# Patient Record
Sex: Female | Born: 1990 | Hispanic: No | Marital: Married | Smoking: Never smoker
Health system: Southern US, Community
[De-identification: ages and names within clinical notes are randomized; demographics above are authoritative.]

## PROBLEM LIST (undated history)

## (undated) ENCOUNTER — Inpatient Hospital Stay (HOSPITAL_COMMUNITY): Payer: Self-pay

## (undated) DIAGNOSIS — D649 Anemia, unspecified: Secondary | ICD-10-CM

## (undated) DIAGNOSIS — Z789 Other specified health status: Secondary | ICD-10-CM

## (undated) DIAGNOSIS — M069 Rheumatoid arthritis, unspecified: Secondary | ICD-10-CM

## (undated) HISTORY — DX: Rheumatoid arthritis, unspecified: M06.9

## (undated) HISTORY — PX: NO PAST SURGERIES: SHX2092

---

## 2016-11-13 NOTE — L&D Delivery Note (Addendum)
Patient was C/C/+1 and pushed for 1hr 30 minutes with epidural.   NSVD female infant, Apgars 8/9, weight pending.   The patient had 2nd laceration repaired with 2-0 vicryl Fundus was firm. EBL was expected amount. Placenta was delivered intact. Vagina was clear.  Pt developed temp to 100.3 and baby was tachycardic, tylenol was given and abx arrived after delivery, advised RN to administer one post delivery dose of amp and gent.  Baby was vigorous and doing skin to skin with mother.  Jamie Kerr, Jamie Kerr

## 2017-06-11 LAB — OB RESULTS CONSOLE RUBELLA ANTIBODY, IGM: RUBELLA: IMMUNE

## 2017-06-11 LAB — OB RESULTS CONSOLE HEPATITIS B SURFACE ANTIGEN: HEP B S AG: NEGATIVE

## 2017-06-11 LAB — OB RESULTS CONSOLE ABO/RH: RH Type: POSITIVE

## 2017-06-11 LAB — OB RESULTS CONSOLE HIV ANTIBODY (ROUTINE TESTING): HIV: NONREACTIVE

## 2017-06-11 LAB — OB RESULTS CONSOLE RPR: RPR: NONREACTIVE

## 2017-06-11 LAB — OB RESULTS CONSOLE GC/CHLAMYDIA
CHLAMYDIA, DNA PROBE: NEGATIVE
Gonorrhea: NEGATIVE

## 2017-06-11 LAB — OB RESULTS CONSOLE ANTIBODY SCREEN: ANTIBODY SCREEN: NEGATIVE

## 2017-07-08 ENCOUNTER — Encounter (HOSPITAL_COMMUNITY): Payer: Self-pay

## 2017-07-08 ENCOUNTER — Inpatient Hospital Stay (HOSPITAL_COMMUNITY)
Admission: AD | Admit: 2017-07-08 | Discharge: 2017-07-08 | Disposition: A | Discharge status: Home / Self Care | Payer: Medicaid Other | Source: Ambulatory Visit | Attending: Obstetrics & Gynecology | Admitting: Obstetrics & Gynecology

## 2017-07-08 ENCOUNTER — Inpatient Hospital Stay (HOSPITAL_COMMUNITY): Payer: Medicaid Other

## 2017-07-08 DIAGNOSIS — R1030 Lower abdominal pain, unspecified: Secondary | ICD-10-CM | POA: Diagnosis not present

## 2017-07-08 DIAGNOSIS — O4702 False labor before 37 completed weeks of gestation, second trimester: Secondary | ICD-10-CM

## 2017-07-08 DIAGNOSIS — Z79899 Other long term (current) drug therapy: Secondary | ICD-10-CM | POA: Diagnosis not present

## 2017-07-08 DIAGNOSIS — Z3686 Encounter for antenatal screening for cervical length: Secondary | ICD-10-CM

## 2017-07-08 DIAGNOSIS — O26892 Other specified pregnancy related conditions, second trimester: Secondary | ICD-10-CM | POA: Insufficient documentation

## 2017-07-08 DIAGNOSIS — Z3A25 25 weeks gestation of pregnancy: Secondary | ICD-10-CM | POA: Insufficient documentation

## 2017-07-08 DIAGNOSIS — R109 Unspecified abdominal pain: Secondary | ICD-10-CM

## 2017-07-08 HISTORY — DX: Other specified health status: Z78.9

## 2017-07-08 LAB — URINALYSIS, ROUTINE W REFLEX MICROSCOPIC
Bilirubin Urine: NEGATIVE
GLUCOSE, UA: NEGATIVE mg/dL
HGB URINE DIPSTICK: NEGATIVE
Ketones, ur: NEGATIVE mg/dL
LEUKOCYTES UA: NEGATIVE
Nitrite: NEGATIVE
Protein, ur: NEGATIVE mg/dL
SPECIFIC GRAVITY, URINE: 1.006 (ref 1.005–1.030)
pH: 7 (ref 5.0–8.0)

## 2017-07-08 LAB — WET PREP, GENITAL
CLUE CELLS WET PREP: NONE SEEN
Sperm: NONE SEEN
Trich, Wet Prep: NONE SEEN
Yeast Wet Prep HPF POC: NONE SEEN

## 2017-07-08 MED ORDER — NIFEDIPINE 10 MG PO CAPS
10.0000 mg | ORAL_CAPSULE | Freq: Once | ORAL | Status: AC
Start: 2017-07-08 — End: 2017-07-08
  Administered 2017-07-08: 10 mg via ORAL
  Filled 2017-07-08: qty 1

## 2017-07-08 MED ORDER — NIFEDIPINE 10 MG PO CAPS: 10.0000 mg | ORAL_CAPSULE | ORAL | Status: DC | PRN

## 2017-07-08 MED ORDER — SODIUM CHLORIDE 0.9 % IV BOLUS (SEPSIS): 1000.0000 mL | Freq: Once | INTRAVENOUS | Status: DC

## 2017-07-08 NOTE — MAU Provider Note (Signed)
History     CSN: [id number]  Arrival date and time: 07/08/17 1146   First Provider Initiated Contact with Patient 07/08/17 1208      Chief Complaint  Patient presents with  . Contractions   HPI Jamie Kerr 26 y.o. [redacted]w[redacted]d  Was having lower abdominal pain last night and still is having some pain today.  Came in for evaluation.  Speaks Urdu.  Phone interpreter used for medical interpretation for interview and exam.  Next appointment in the office is Sept 13.      OB History    Gravida Para Term Preterm AB Living   1             SAB TAB Ectopic Multiple Live Births                  Past Medical History:  Diagnosis Date  . Medical history non-contributory     Past Surgical History:  Procedure Laterality Date  . NO PAST SURGERIES      History reviewed. No pertinent family history.  Social History  Substance Use Topics  . Smoking status: Never Smoker  . Smokeless tobacco: Never Used  . Alcohol use No    Allergies: No Known Allergies  Prescriptions Prior to Admission  Medication Sig Dispense Refill Last Dose  . ferrous sulfate 325 (65 FE) MG tablet Take 325 mg by mouth daily with breakfast.   07/08/2017 at Unknown time  . Prenatal Vit-Fe Fumarate-FA (PRENATAL MULTIVITAMIN) TABS tablet Take 1 tablet by mouth daily.   07/08/2017 at Unknown time    Review of Systems  Constitutional: Negative for fever.  Gastrointestinal: Positive for abdominal pain. Negative for constipation, diarrhea, nausea and vomiting.  Genitourinary: Positive for vaginal discharge. Negative for dysuria.       Has periodic vaginal discharge   Physical Exam   Blood pressure 112/87, pulse 85, temperature 98.6 F (37 C), temperature source Oral, resp. rate 16, height 5\' 3"  (1.6 m), weight 128 lb (58.1 kg).  Physical Exam  Nursing note and vitals reviewed. Constitutional: She is oriented to person, place, and time. She appears well-developed and well-nourished.  HENT:  Head: Normocephalic.   Eyes: EOM are normal.  Neck: Neck supple.  GI: Soft. There is no tenderness.  Fetal monitor applied.  No uterine contractions palpated.  Periodic very mild uterine irritability seen on the monitor strip.  Client states her pain is less now than last night.  FHT  Genitourinary:  Genitourinary Comments: Speculum exam: Introitus - small opening, slightly red, no discharge seen, nontender Vagina - Small amount of white discharge, no odor Cervix - No contact bleeding, appears closed Client tense with speculum exam Bimanual exam: Gentle one finger exam - unable to reach cervix wet prep done Chaperone present for exam.  Musculoskeletal: Normal range of motion.  Neurological: She is alert and oriented to person, place, and time.  Skin: Skin is warm and dry.  Psychiatric: She has a normal mood and affect.    MAU Course  Procedures Results for orders placed or performed during the hospital encounter of 07/08/17 (from the past 24 hour(s))  Urinalysis, Routine w reflex microscopic     Status: Abnormal   Collection Time: 07/08/17 12:00 PM  Result Value Ref Range   Color, Urine STRAW (A) YELLOW   APPearance CLEAR CLEAR   Specific Gravity, Urine 1.006 1.005 - 1.030   pH 7.0 5.0 - 8.0   Glucose, UA NEGATIVE NEGATIVE mg/dL   Hgb urine dipstick NEGATIVE  NEGATIVE   Bilirubin Urine NEGATIVE NEGATIVE   Ketones, ur NEGATIVE NEGATIVE mg/dL   Protein, ur NEGATIVE NEGATIVE mg/dL   Nitrite NEGATIVE NEGATIVE   Leukocytes, UA NEGATIVE NEGATIVE  Wet prep, genital     Status: Abnormal   Collection Time: 07/08/17  1:26 PM  Result Value Ref Range   Yeast Wet Prep HPF POC NONE SEEN NONE SEEN   Trich, Wet Prep NONE SEEN NONE SEEN   Clue Cells Wet Prep HPF POC NONE SEEN NONE SEEN   WBC, Wet Prep HPF POC MANY (A) NONE SEEN   Sperm NONE SEEN     MDM Understands lots of English but having trouble answering questions like the same or worse.  Questioned whether she understands medical English so using the  phone interpreter for medical English.  Interpreter on the phone also has trouble understanding all medical English.  1400 Consult with Dr. Mora Appl, ordered Procardia and [place] for cervical length 1512  Consult with Dr. Mora Appl - cervical length 3.98 and client no longer feeling abdominal pain after Procardia 10 mg PO.  Will discharge and give 10 tablets of Procardia to use PRN if feeling abdominal pain.  Assessment and Plan  Abdominal pain in pregnancy, second trimester but cervix is closed.  Plan Keep your appointment in the office. Get your prescription from your pharmacy and take the Procardia again if you are having abdominal pain like you were having last night. Call the office if you are not doing well. Continue to drink fluids well as you have been doing. Return if you have any vaginal leaking, vaginal bleeding or worsening abdominal pain. Terri L Burleson 07/08/2017, 1:34 PM

## 2017-07-08 NOTE — MAU Note (Signed)
Reports ctx since last night. Reports they are mildly painful. No vaginal bleeding. Reports some wetness in vaginal area. +fetal movement.

## 2017-07-08 NOTE — Discharge Instructions (Signed)
Keep your appointment in the office. Get your prescription from your pharmacy and take the Procardia again if you are having abdominal pain like you were having last night. Call the office if you are not doing well. Continue to drink fluids well as you have been doing. Return if you have any vaginal leaking, vaginal bleeding or worsening abdominal pain.

## 2017-09-20 LAB — OB RESULTS CONSOLE GBS: GBS: NEGATIVE

## 2017-10-15 ENCOUNTER — Inpatient Hospital Stay (HOSPITAL_COMMUNITY): Payer: Medicaid Other | Admitting: Anesthesiology

## 2017-10-15 ENCOUNTER — Inpatient Hospital Stay (HOSPITAL_COMMUNITY)
Admission: AD | Admit: 2017-10-15 | Discharge: 2017-10-17 | DRG: 807 | Disposition: A | Discharge status: Home / Self Care | Payer: Medicaid Other | Source: Ambulatory Visit | Attending: Obstetrics and Gynecology | Admitting: Obstetrics and Gynecology

## 2017-10-15 ENCOUNTER — Encounter (HOSPITAL_COMMUNITY): Payer: Self-pay | Admitting: *Deleted

## 2017-10-15 ENCOUNTER — Inpatient Hospital Stay (HOSPITAL_COMMUNITY)
Admission: AD | Admit: 2017-10-15 | Discharge: 2017-10-15 | Disposition: A | Discharge status: Home / Self Care | Payer: Medicaid Other | Source: Ambulatory Visit | Attending: Obstetrics and Gynecology | Admitting: Obstetrics and Gynecology

## 2017-10-15 ENCOUNTER — Encounter (HOSPITAL_COMMUNITY): Payer: Self-pay | Admitting: Anesthesiology

## 2017-10-15 ENCOUNTER — Other Ambulatory Visit: Payer: Self-pay

## 2017-10-15 ENCOUNTER — Encounter (HOSPITAL_COMMUNITY): Payer: Self-pay

## 2017-10-15 ENCOUNTER — Other Ambulatory Visit: Payer: Self-pay | Admitting: Obstetrics and Gynecology

## 2017-10-15 ENCOUNTER — Telehealth (HOSPITAL_COMMUNITY): Payer: Self-pay | Admitting: *Deleted

## 2017-10-15 DIAGNOSIS — Z3A40 40 weeks gestation of pregnancy: Secondary | ICD-10-CM | POA: Diagnosis not present

## 2017-10-15 DIAGNOSIS — N858 Other specified noninflammatory disorders of uterus: Secondary | ICD-10-CM

## 2017-10-15 DIAGNOSIS — Z3483 Encounter for supervision of other normal pregnancy, third trimester: Secondary | ICD-10-CM | POA: Diagnosis present

## 2017-10-15 DIAGNOSIS — O479 False labor, unspecified: Secondary | ICD-10-CM

## 2017-10-15 LAB — CBC
HEMATOCRIT: 31.5 % — AB (ref 36.0–46.0)
HEMOGLOBIN: 10 g/dL — AB (ref 12.0–15.0)
MCH: 27 pg (ref 26.0–34.0)
MCHC: 31.7 g/dL (ref 30.0–36.0)
MCV: 84.9 fL (ref 78.0–100.0)
Platelets: 390 10*3/uL (ref 150–400)
RBC: 3.71 MIL/uL — AB (ref 3.87–5.11)
RDW: 13.3 % (ref 11.5–15.5)
WBC: 13.3 10*3/uL — ABNORMAL HIGH (ref 4.0–10.5)

## 2017-10-15 LAB — TYPE AND SCREEN
ABO/RH(D): O POS
ANTIBODY SCREEN: NEGATIVE

## 2017-10-15 LAB — ABO/RH: ABO/RH(D): O POS

## 2017-10-15 MED ORDER — LIDOCAINE HCL (PF) 1 % IJ SOLN
30.0000 mL | INTRAMUSCULAR | Status: DC | PRN
  Administered 2017-10-16: 30 mL via SUBCUTANEOUS
  Filled 2017-10-15: qty 30

## 2017-10-15 MED ORDER — LACTATED RINGERS IV SOLN: INTRAVENOUS | Status: DC

## 2017-10-15 MED ORDER — FENTANYL 2.5 MCG/ML BUPIVACAINE 1/10 % EPIDURAL INFUSION (WH - ANES)
14.0000 mL/h | INTRAMUSCULAR | Status: DC | PRN
  Administered 2017-10-16: 14 mL/h via EPIDURAL
  Filled 2017-10-15: qty 100

## 2017-10-15 MED ORDER — ACETAMINOPHEN 325 MG PO TABS
650.0000 mg | ORAL_TABLET | ORAL | Status: DC | PRN
  Administered 2017-10-16: 650 mg via ORAL
  Filled 2017-10-15: qty 2

## 2017-10-15 MED ORDER — FLEET ENEMA 7-19 GM/118ML RE ENEM: 1.0000 | ENEMA | RECTAL | Status: DC | PRN

## 2017-10-15 MED ORDER — EPHEDRINE 5 MG/ML INJ
10.0000 mg | INTRAVENOUS | Status: DC | PRN
  Filled 2017-10-15: qty 2

## 2017-10-15 MED ORDER — FENTANYL CITRATE (PF) 100 MCG/2ML IJ SOLN
100.0000 ug | INTRAMUSCULAR | Status: DC | PRN
  Administered 2017-10-15 (×2): 100 ug via INTRAVENOUS
  Filled 2017-10-15 (×2): qty 2

## 2017-10-15 MED ORDER — ONDANSETRON HCL 4 MG/2ML IJ SOLN
4.0000 mg | Freq: Four times a day (QID) | INTRAMUSCULAR | Status: DC | PRN
  Administered 2017-10-16: 4 mg via INTRAVENOUS
  Filled 2017-10-15: qty 2

## 2017-10-15 MED ORDER — OXYCODONE-ACETAMINOPHEN 5-325 MG PO TABS: 1.0000 | ORAL_TABLET | ORAL | Status: DC | PRN

## 2017-10-15 MED ORDER — LACTATED RINGERS IV SOLN
500.0000 mL | Freq: Once | INTRAVENOUS | Status: AC
  Administered 2017-10-15: 500 mL via INTRAVENOUS

## 2017-10-15 MED ORDER — OXYTOCIN 40 UNITS IN LACTATED RINGERS INFUSION - SIMPLE MED
2.5000 [IU]/h | INTRAVENOUS | Status: DC
  Filled 2017-10-15: qty 1000

## 2017-10-15 MED ORDER — OXYTOCIN BOLUS FROM INFUSION
500.0000 mL | Freq: Once | INTRAVENOUS | Status: AC
  Administered 2017-10-16: 500 mL via INTRAVENOUS

## 2017-10-15 MED ORDER — PHENYLEPHRINE 40 MCG/ML (10ML) SYRINGE FOR IV PUSH (FOR BLOOD PRESSURE SUPPORT)
80.0000 ug | PREFILLED_SYRINGE | INTRAVENOUS | Status: DC | PRN
  Filled 2017-10-15: qty 10
  Filled 2017-10-15: qty 5

## 2017-10-15 MED ORDER — SOD CITRATE-CITRIC ACID 500-334 MG/5ML PO SOLN: 30.0000 mL | ORAL | Status: DC | PRN

## 2017-10-15 MED ORDER — PHENYLEPHRINE 40 MCG/ML (10ML) SYRINGE FOR IV PUSH (FOR BLOOD PRESSURE SUPPORT)
80.0000 ug | PREFILLED_SYRINGE | INTRAVENOUS | Status: DC | PRN
  Filled 2017-10-15: qty 5

## 2017-10-15 MED ORDER — LACTATED RINGERS IV SOLN: 500.0000 mL | INTRAVENOUS | Status: DC | PRN

## 2017-10-15 MED ORDER — OXYCODONE-ACETAMINOPHEN 5-325 MG PO TABS: 2.0000 | ORAL_TABLET | ORAL | Status: DC | PRN

## 2017-10-15 MED ORDER — DIPHENHYDRAMINE HCL 50 MG/ML IJ SOLN: 12.5000 mg | INTRAMUSCULAR | Status: DC | PRN

## 2017-10-15 NOTE — Discharge Instructions (Signed)

## 2017-10-15 NOTE — MAU Note (Signed)
Pt reports uc's for 2 hours and a white mucus discharge. +FM

## 2017-10-15 NOTE — Anesthesia Pain Management Evaluation Note (Signed)
  CRNA Pain Management Visit Note  Patient: Jamie Kerr, 26 y.o., female  "Hello I am a member of the anesthesia team at [facility]. We have an anesthesia team available at all times to provide care throughout the hospital, including epidural management and anesthesia for C-section. I don't know your plan for the delivery whether it a natural birth, water birth, IV sedation, nitrous supplementation, doula or epidural, but we want to meet your pain goals."   1.Was your pain managed to your expectations on prior hospitalizations?   No prior hospitalizations  2.What is your expectation for pain management during this hospitalization?     IV pain meds  3.How can we help you reach that goal? Be available, patient desires natural but is considering epidural, questions answered  Record the patient's initial score and the patient's pain goal.   Pain: 7  Pain Goal: 5 [facility] wants you to be able to say your pain was always managed very well.  [facility] CenterMERRITT,Braeson Rupe 10/15/2017

## 2017-10-15 NOTE — Telephone Encounter (Signed)
Preadmission screen  

## 2017-10-15 NOTE — MAU Note (Signed)
I have communicated with Dr. Henderson CloudHorvath and reviewed vital signs:  Vitals:   10/15/17 0645 10/15/17 0725  BP: 107/84 109/64  Pulse: (!) 104 69  Resp: 18 18  Temp: 97.9 F (36.6 C)     Vaginal exam:  Dilation: 2 Effacement (%): 90 Cervical Position: Posterior Station: -3 Presentation: Vertex Exam by:: B Bayler Nehring RN,   Also reviewed contraction pattern and that non-stress test is reactive.  It has been documented that patient is contracting every 3-5 minutes. Her cervix is 2/90%/-3 posterior and firm. No bloody show. Pt has an appointment at 10 am. Patient denies any other complaints.  Based on this report provider has given order for discharge.  A discharge order and diagnosis entered by a provider.   Labor discharge instructions reviewed with patient.

## 2017-10-15 NOTE — H&P (Signed)
26 y.o. 3484w0d  G1P0 comes in c/o ctx.  Otherwise has good fetal movement and no bleeding.  Was seen for labor check early this morning and found to be 2cm, essentially unchanged from office exam.  Returns with worsening contractions.  Past Medical History:  Diagnosis Date  . Medical history non-contributory     Past Surgical History:  Procedure Laterality Date  . NO PAST SURGERIES      OB History  Gravida Para Term Preterm AB Living  1            SAB TAB Ectopic Multiple Live Births          0    # Outcome Date GA Lbr Len/2nd Weight Sex Delivery Anes PTL Lv  1 Current               Social History   Socioeconomic History  . Marital status: Married    Spouse name: Not on file  . Number of children: Not on file  . Years of education: Not on file  . Highest education level: Not on file  Social Needs  . Financial resource strain: Not on file  . Food insecurity - worry: Not on file  . Food insecurity - inability: Not on file  . Transportation needs - medical: Not on file  . Transportation needs - non-medical: Not on file  Occupational History  . Not on file  Tobacco Use  . Smoking status: Never Smoker  . Smokeless tobacco: Never Used  Substance and Sexual Activity  . Alcohol use: No  . Drug use: No  . Sexual activity: Not on file  Other Topics Concern  . Not on file  Social History Narrative  . Not on file   Patient has no known allergies.    Prenatal Transfer Tool  Maternal Diabetes: No Genetic Screening: Normal Maternal Ultrasounds/Referrals: Normal Fetal Ultrasounds or other Referrals:  None Maternal Substance Abuse:  No Significant Maternal Medications:  None Significant Maternal Lab Results: Lab values include: Group B Strep negative  Other PNC: uncomplicated.  EFW for S<D @33  weeks: 37%    Vitals:   10/15/17 1843 10/15/17 1911  BP:    Pulse:    Resp: 16 18  Temp:       Lungs/Cor:  NAD Abdomen:  soft, gravid Ex:  no cords, erythema SVE:   4/90/-3 FHTs:  130, good STV, NST R Toco:  q2-4   A/P   Admit to L&D for labor  GBS Neg  PT requests IV pain medication, declines epidural, available if desired. Other routine care.  Philip AspenALLAHAN, Kaydon Creedon

## 2017-10-15 NOTE — MAU Note (Signed)
Pt reports worsening contractions, denies bleeding , ? ROM

## 2017-10-16 ENCOUNTER — Encounter (HOSPITAL_COMMUNITY): Payer: Self-pay | Admitting: *Deleted

## 2017-10-16 LAB — RPR: RPR Ser Ql: NONREACTIVE

## 2017-10-16 MED ORDER — LIDOCAINE HCL (PF) 1 % IJ SOLN
INTRAMUSCULAR | Status: DC | PRN
  Administered 2017-10-15 – 2017-10-16 (×3): 4 mL via EPIDURAL

## 2017-10-16 MED ORDER — GENTAMICIN SULFATE 40 MG/ML IJ SOLN
1.5000 mg/kg | Freq: Once | INTRAVENOUS | Status: AC
  Administered 2017-10-16: 100 mg via INTRAVENOUS
  Filled 2017-10-16: qty 2.5

## 2017-10-16 MED ORDER — DIBUCAINE 1 % RE OINT
1.0000 | TOPICAL_OINTMENT | RECTAL | Status: DC | PRN
Start: 2017-10-16 — End: 2017-10-17

## 2017-10-16 MED ORDER — SODIUM CHLORIDE 0.9 % IV SOLN
2.0000 g | Freq: Four times a day (QID) | INTRAVENOUS | Status: DC
  Administered 2017-10-16: 2 g via INTRAVENOUS
  Filled 2017-10-16 (×2): qty 2000

## 2017-10-16 MED ORDER — SENNOSIDES-DOCUSATE SODIUM 8.6-50 MG PO TABS
2.0000 | ORAL_TABLET | ORAL | Status: DC
  Administered 2017-10-16: 2 via ORAL
  Filled 2017-10-16: qty 2

## 2017-10-16 MED ORDER — ONDANSETRON HCL 4 MG PO TABS: 4.0000 mg | ORAL_TABLET | ORAL | Status: DC | PRN

## 2017-10-16 MED ORDER — WITCH HAZEL-GLYCERIN EX PADS: 1.0000 "application " | MEDICATED_PAD | CUTANEOUS | Status: DC | PRN

## 2017-10-16 MED ORDER — OXYCODONE-ACETAMINOPHEN 5-325 MG PO TABS: 1.0000 | ORAL_TABLET | ORAL | Status: DC | PRN

## 2017-10-16 MED ORDER — DIPHENHYDRAMINE HCL 25 MG PO CAPS: 25.0000 mg | ORAL_CAPSULE | Freq: Four times a day (QID) | ORAL | Status: DC | PRN

## 2017-10-16 MED ORDER — ONDANSETRON HCL 4 MG/2ML IJ SOLN: 4.0000 mg | INTRAMUSCULAR | Status: DC | PRN

## 2017-10-16 MED ORDER — BENZOCAINE-MENTHOL 20-0.5 % EX AERO
1.0000 | INHALATION_SPRAY | CUTANEOUS | Status: DC | PRN
Start: 2017-10-16 — End: 2017-10-17
  Administered 2017-10-16: 1 via TOPICAL
  Filled 2017-10-16: qty 56

## 2017-10-16 MED ORDER — OXYCODONE-ACETAMINOPHEN 5-325 MG PO TABS: 2.0000 | ORAL_TABLET | ORAL | Status: DC | PRN

## 2017-10-16 MED ORDER — PRENATAL MULTIVITAMIN CH
1.0000 | ORAL_TABLET | Freq: Every day | ORAL | Status: DC
  Administered 2017-10-16 – 2017-10-17 (×2): 1 via ORAL
  Filled 2017-10-16 (×2): qty 1

## 2017-10-16 MED ORDER — COCONUT OIL OIL: 1.0000 "application " | TOPICAL_OIL | Status: DC | PRN

## 2017-10-16 MED ORDER — IBUPROFEN 600 MG PO TABS
600.0000 mg | ORAL_TABLET | Freq: Four times a day (QID) | ORAL | Status: DC
  Administered 2017-10-16 – 2017-10-17 (×5): 600 mg via ORAL
  Filled 2017-10-16 (×5): qty 1

## 2017-10-16 MED ORDER — SIMETHICONE 80 MG PO CHEW: 80.0000 mg | CHEWABLE_TABLET | ORAL | Status: DC | PRN

## 2017-10-16 MED ORDER — ZOLPIDEM TARTRATE 5 MG PO TABS: 5.0000 mg | ORAL_TABLET | Freq: Every evening | ORAL | Status: DC | PRN

## 2017-10-16 MED ORDER — TETANUS-DIPHTH-ACELL PERTUSSIS 5-2.5-18.5 LF-MCG/0.5 IM SUSP: 0.5000 mL | Freq: Once | INTRAMUSCULAR | Status: DC

## 2017-10-16 MED ORDER — ACETAMINOPHEN 325 MG PO TABS: 650.0000 mg | ORAL_TABLET | ORAL | Status: DC | PRN

## 2017-10-16 NOTE — Anesthesia Postprocedure Evaluation (Signed)
Anesthesia Post Note  Patient: Jamie Kerr  Procedure(s) Performed: AN AD HOC LABOR EPIDURAL     Patient location during evaluation: Mother Baby Anesthesia Type: Epidural Level of consciousness: awake, awake and alert, oriented and patient cooperative Pain management: pain level controlled Vital Signs Assessment: post-procedure vital signs reviewed and stable Respiratory status: spontaneous breathing, nonlabored ventilation and respiratory function stable Cardiovascular status: stable Postop Assessment: no headache, no backache, no apparent nausea or vomiting and patient able to bend at knees Anesthetic complications: no    Last Vitals:  Vitals:   10/16/17 0830 10/16/17 0930  BP: 109/62 (!) 105/55  Pulse: 75 82  Resp: 18 18  Temp: 36.9 C 36.7 C  SpO2:      Last Pain:  Vitals:   10/16/17 0930  TempSrc: Oral  PainSc: 0-No pain   Pain Goal:                 Takeira Yanes L

## 2017-10-16 NOTE — Lactation Note (Signed)
This note was copied from a baby's chart. Lactation Consultation Note  Patient Name: Boy Lennie OdorSumiya Iten ZOXWR'UToday's Date: 10/16/2017 Reason for consult: Initial assessment;Primapara;Term Breastfeeding consultation services and support information given and reviewed.  This is mom's first baby and newborn is 4 hours old.  Mom reports baby has latched easily and fed well.  Instructed to feed with any cue.  Offered assist but mom declined.  Encouraged to call for assist/concerns prn.  Maternal Data    Feeding Length of feed: 20 min(off and on)  LATCH Score Latch: Grasps breast easily, tongue down, lips flanged, rhythmical sucking.  Audible Swallowing: None(sucking intermittently, sleepy)  Type of Nipple: Everted at rest and after stimulation  Comfort (Breast/Nipple): Soft / non-tender  Hold (Positioning): No assistance needed to correctly position infant at breast.  LATCH Score: 8  Interventions    Lactation Tools Discussed/Used     Consult Status Consult Status: Follow-up Date: 10/17/17 Follow-up type: In-patient    Huston FoleyMOULDEN, Amany Rando S 10/16/2017, 11:20 AM

## 2017-10-16 NOTE — Anesthesia Procedure Notes (Signed)
Epidural Patient location during procedure: OB Start time: 10/16/2017 11:50 PM  Staffing Anesthesiologist: Leonides GrillsEllender, Oskar Cretella P, MD Performed: anesthesiologist   Preanesthetic Checklist Completed: patient identified, site marked, pre-op evaluation, timeout performed, IV checked, risks and benefits discussed and monitors and equipment checked  Epidural Patient position: sitting Prep: DuraPrep Patient monitoring: heart rate, cardiac monitor, continuous pulse ox and blood pressure Approach: midline Location: L4-L5 Injection technique: LOR air  Needle:  Needle type: Tuohy  Needle gauge: 17 G Needle length: 9 cm Needle insertion depth: 4 cm Catheter type: closed end flexible Catheter size: 19 Gauge Catheter at skin depth: 9 cm Test dose: negative and Other  Assessment Events: blood not aspirated, injection not painful, no injection resistance and negative IV test  Additional Notes Informed consent obtained prior to proceeding including risk of failure, 1% risk of PDPH, risk of minor discomfort and bruising. Discussed alternatives to epidural analgesia and patient desires to proceed.  Timeout performed pre-procedure verifying patient name, procedure, and platelet count.  Patient tolerated procedure well. Reason for block:procedure for pain

## 2017-10-16 NOTE — Anesthesia Preprocedure Evaluation (Signed)
Anesthesia Evaluation  Patient identified by MRN, date of birth, ID band Patient awake    Reviewed: Allergy & Precautions, H&P , NPO status , Patient's Chart, lab work & pertinent test results  History of Anesthesia Complications Negative for: history of anesthetic complications  Airway Mallampati: II  TM Distance: >3 FB Neck ROM: full    Dental no notable dental hx. (+) Teeth Intact   Pulmonary neg pulmonary ROS,    Pulmonary exam normal breath sounds clear to auscultation       Cardiovascular negative cardio ROS Normal cardiovascular exam Rhythm:regular Rate:Normal     Neuro/Psych negative neurological ROS  negative psych ROS   GI/Hepatic negative GI ROS, Neg liver ROS,   Endo/Other  negative endocrine ROS  Renal/GU negative Renal ROS  negative genitourinary   Musculoskeletal   Abdominal   Peds  Hematology  (+) anemia ,   Anesthesia Other Findings   Reproductive/Obstetrics (+) Pregnancy                             Anesthesia Physical  Anesthesia Plan  ASA: II  Anesthesia Plan: Epidural   Post-op Pain Management:    Induction:   PONV Risk Score and Plan:   Airway Management Planned:   Additional Equipment:   Intra-op Plan:   Post-operative Plan:   Informed Consent: I have reviewed the patients History and Physical, chart, labs and discussed the procedure including the risks, benefits and alternatives for the proposed anesthesia with the patient or authorized representative who has indicated his/her understanding and acceptance.       Plan Discussed with:   Anesthesia Plan Comments:         Anesthesia Quick Evaluation  

## 2017-10-17 LAB — CBC
HEMATOCRIT: 25.7 % — AB (ref 36.0–46.0)
HEMOGLOBIN: 8.3 g/dL — AB (ref 12.0–15.0)
MCH: 27.9 pg (ref 26.0–34.0)
MCHC: 32.3 g/dL (ref 30.0–36.0)
MCV: 86.2 fL (ref 78.0–100.0)
Platelets: 317 10*3/uL (ref 150–400)
RBC: 2.98 MIL/uL — ABNORMAL LOW (ref 3.87–5.11)
RDW: 13.6 % (ref 11.5–15.5)
WBC: 18.9 10*3/uL — AB (ref 4.0–10.5)

## 2017-10-17 NOTE — Progress Notes (Signed)
Patient is eating, ambulating, voiding.  Pain control is good.  Appropriate lochia.  Reports chest pain with deep inhalation, no SOB.  No other complaints.  Vitals:   10/16/17 0830 10/16/17 0930 10/16/17 1425 10/17/17 0519  BP: 109/62 (!) 105/55 (!) 93/43 111/69  Pulse: 75 82 81 88  Resp: 18 18 18 20   Temp: 98.4 F (36.9 C) 98.1 F (36.7 C) 98.2 F (36.8 C) 97.9 F (36.6 C)  TempSrc: Oral Oral Oral Oral  SpO2:      Weight:      Height:        Fundus firm Perineum without swelling.  Lab Results  Component Value Date   WBC 18.9 (H) 10/17/2017   HGB 8.3 (L) 10/17/2017   HCT 25.7 (L) 10/17/2017   MCV 86.2 10/17/2017   PLT 317 10/17/2017    --/--/O POS, O POS (12/03 1805)  A/P Post partum day 1 Pain with deep inhalation- all vitals normal, asked nurse to provide pt with incentive spirometer.  If not resolved, advised pt to stay until tomorrow Desires to d/c home today, awaiting ok from pediatrician  Routine care.    Philip AspenALLAHAN, [place]

## 2017-10-17 NOTE — Lactation Note (Signed)
This note was copied from a baby's chart. Lactation Consultation Note  Patient Name: Jamie Kerr UJWJX'BToday's Date: 10/17/2017 Reason for consult: Follow-up assessment;Primapara;1st time breastfeeding;Term;Nipple pain/trauma;Infant weight loss Baby is 30 hours old. 3% weight loss, LC reviewed and updated the doc flow sheets. WNL for D/C .  MBURN requested the LC assess tongue  mobility due to nipple soreness and latching discomfort.  Baby awake and hungry.  LC assessed breast tissue 1st with moms permission, noted the nipples to be pinky red, areola edema  And sensitive / tender when compressed.  LC instructed mom on the use shells and hand pump and recommended due to soreness, prior to every feeding  Breast massage, hand express, pre-pump to prime the milk ducts, and latch STS , firm support and breast compressions.  Breast compressions intermittently with latch .  Sore nipple and engorgement prevention and tx reviewed.  @ the consult LC assisted with latch, STS - see below for findings.  LC noted  tongue  mobility limitations. Baby able to raise the sides of tongue and the midline lingual frenulum is tight.  Mom did experience discomfort with latch and eased up and abby started getting into a pattern.  Baby fed greater than 15 mins.  LC recommended since [facility] [address] To call and make appt. With barb Carder< LC  And to have the Pedis check the tongue - tie noted by this LC .  Mother informed of post-discharge support and given phone number to the lactation department, including services for phone call assistance; out-patient appointments; and breastfeeding support group. List of other breastfeeding resources in the community given in the handout. Encouraged mother to call for problems or concerns related to breastfeeding.   Maternal Data Has patient been taught Hand Expression?: Yes Does the patient have breastfeeding experience prior to this  delivery?: No  Feeding Feeding Type: Breast Fed Length of feed: (still feeding at 15 mins , multiple swallows , increased with breast compressions )  LATCH Score Latch: Grasps breast easily, tongue down, lips flanged, rhythmical sucking.  Audible Swallowing: Spontaneous and intermittent  Type of Nipple: Everted at rest and after stimulation  Comfort (Breast/Nipple): Soft / non-tender  Hold (Positioning): Assistance needed to correctly position infant at breast and maintain latch.  LATCH Score: 9  Interventions Interventions: Breast feeding basics reviewed;Assisted with latch;Skin to skin;Breast massage;Hand express;Pre-pump if needed;Breast compression;Adjust position;Support pillows;Position options;Expressed milk;Shells;DEBP  Lactation Tools Discussed/Used Tools: Shells;Pump;Feeding cup Shell Type: Inverted Breast pump type: Manual WIC Program: No Pump Review: Setup, frequency, and cleaning;Milk Storage Initiated by:: MAI  Date initiated:: 10/17/17   Consult Status Consult Status: Follow-up(LC recommended since [facility] will [facility] / to call [facility] Barb Carder IB[facility] for appt. ) Date: 10/17/17 Follow-up type: Out-patient    Matilde SprangMargaret Ann Gara Kincade 10/17/2017, 1:38 PM

## 2017-10-22 ENCOUNTER — Inpatient Hospital Stay (HOSPITAL_COMMUNITY): Payer: Medicaid Other

## 2017-10-22 NOTE — Discharge Summary (Signed)
Obstetric Discharge Summary Reason for Admission: onset of labor Prenatal Procedures: ultrasound Intrapartum Procedures: spontaneous vaginal delivery Postpartum Procedures: none Complications-Operative and Postpartum: 2nd degree perineal laceration Hemoglobin  Date Value Ref Range Status  10/17/2017 8.3 (L) 12.0 - 15.0 g/dL Final   HCT  Date Value Ref Range Status  10/17/2017 25.7 (L) 36.0 - 46.0 % Final    Physical Exam:  General: alert and cooperative Lochia: appropriate Uterine Fundus: firm DVT Evaluation: No evidence of DVT seen on physical exam.  Discharge Diagnoses: Term Pregnancy-delivered  Discharge Information: Date: 10/22/2017 Activity: pelvic rest Diet: routine Medications: PNV and Ibuprofen Condition: stable Instructions: refer to practice specific booklet Discharge to: home Follow-up Information    Philip AspenCallahan, Massimiliano Rohleder, DO Follow up in 4 week(s).   Specialty:  Obstetrics and Gynecology Contact information: [address] [place] [address] [phone]           Newborn Data: Live born female  Birth Weight: 7 lb 8.3 oz (3410 g) APGAR: 8, 9  Newborn Delivery   Birth date/time:  10/16/2017 06:30:00 Delivery type:  Vaginal, Spontaneous     Home with mother.  Philip AspenCALLAHAN, Paulo Keimig 10/22/2017, 10:54 AM

## 2018-11-05 IMAGING — US US MFM OB TRANSVAGINAL
1 series · 14 of 14 positions shown · non-contrast
Comparison: none

[Series 1: us mfm ob transvaginal · 14 of 14 slices shown]
[im 1/14]
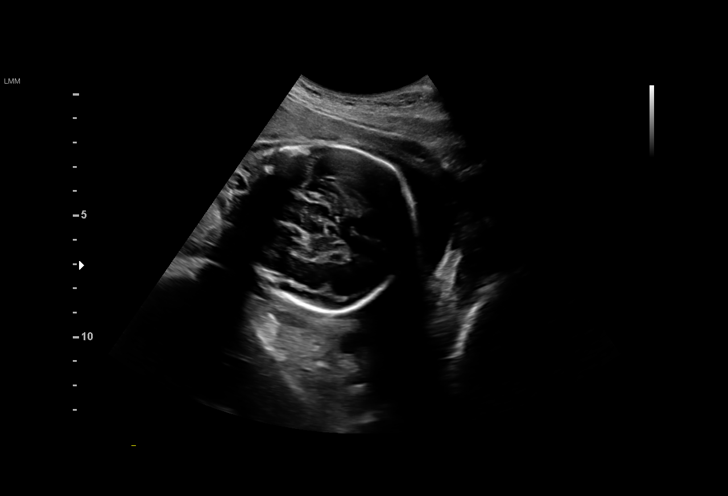
[im 2/14]
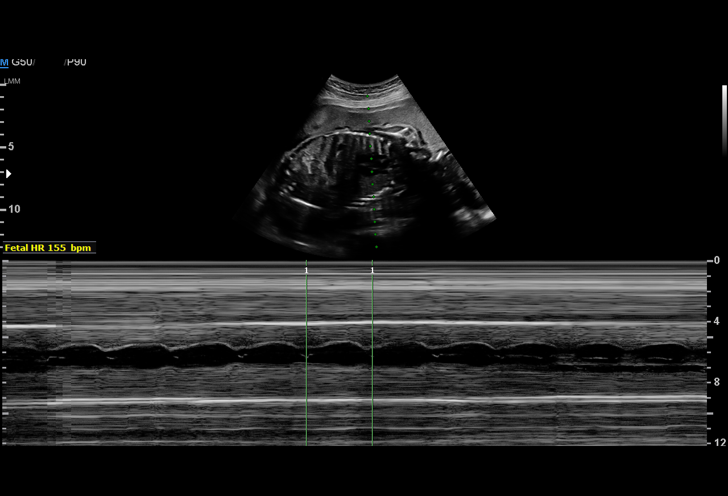
[im 3/14]
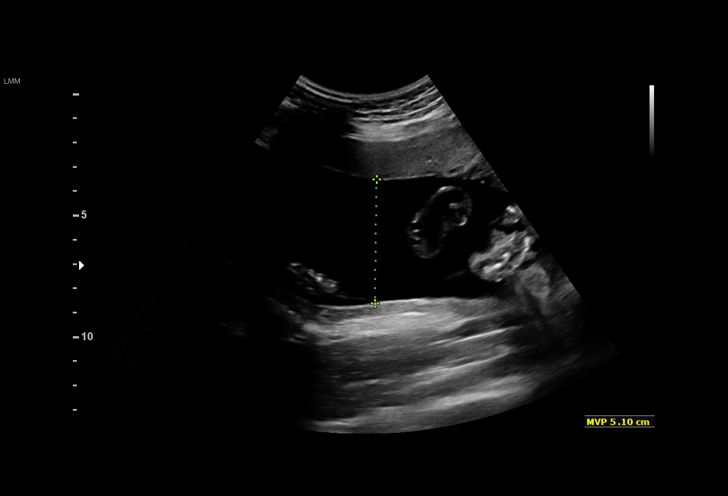
[im 4/14]
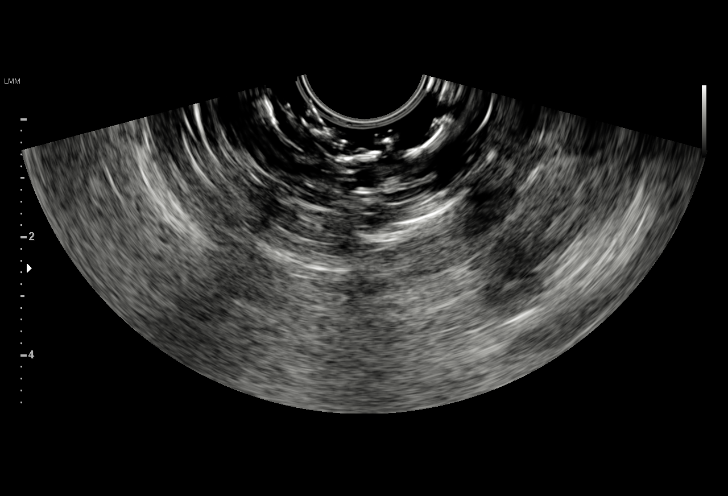
[im 5/14]
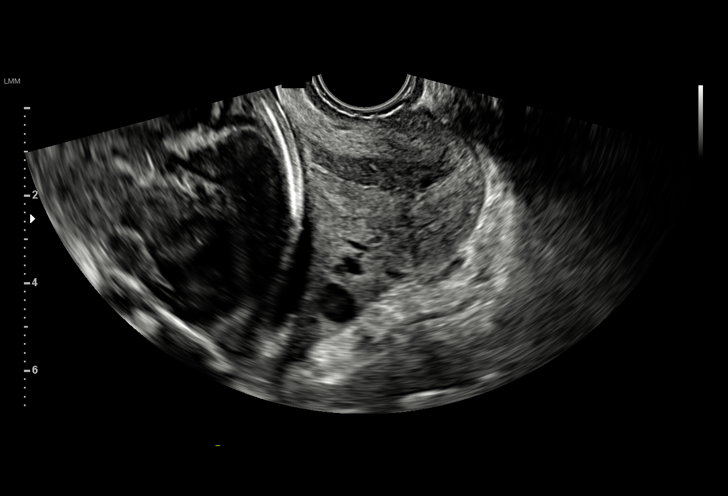
[im 6/14]
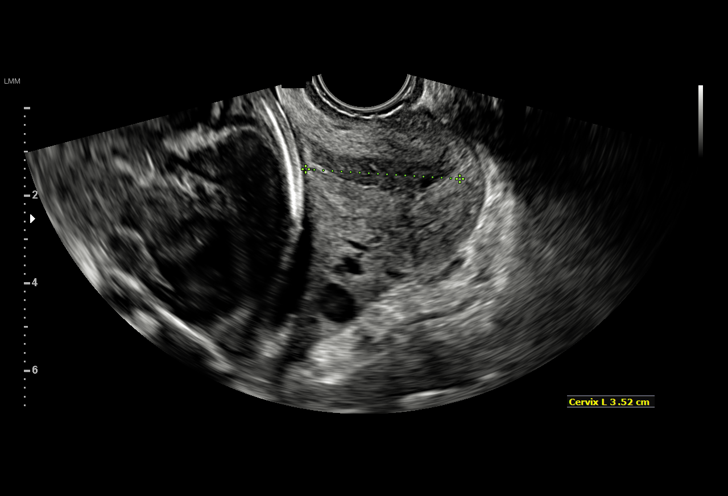
[im 7/14]
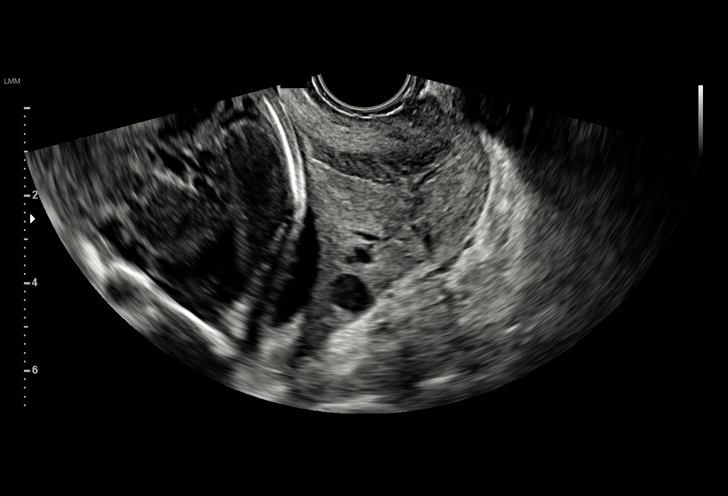
[im 8/14]
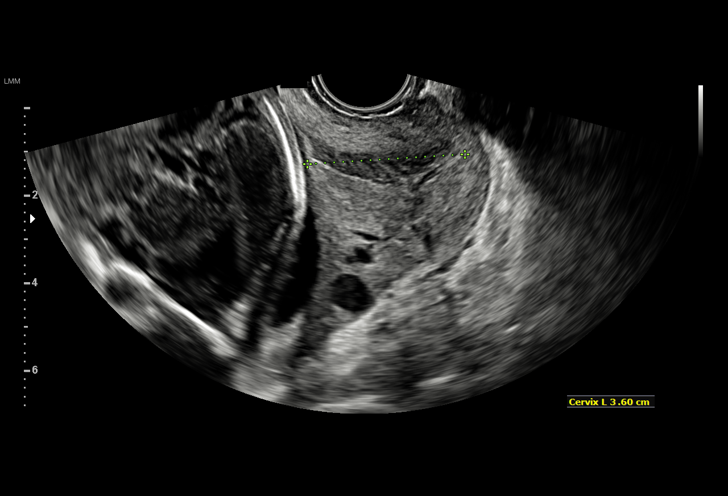
[im 9/14]
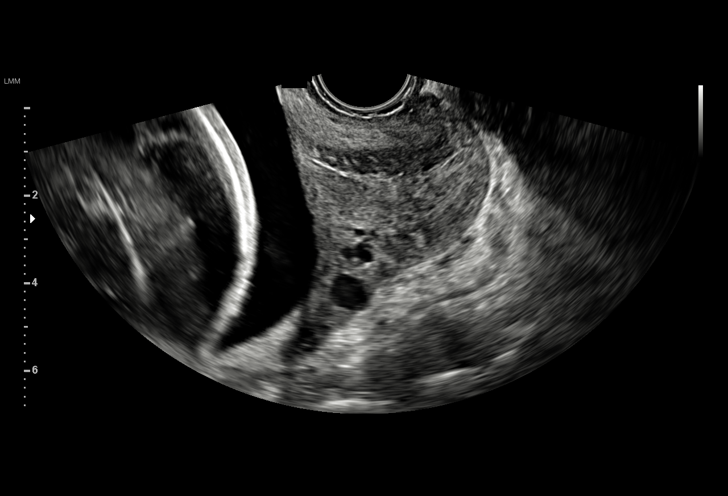
[im 10/14]
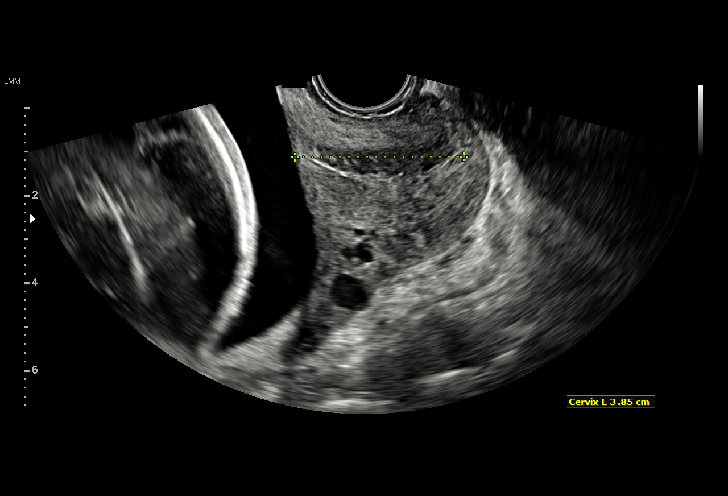
[im 11/14]
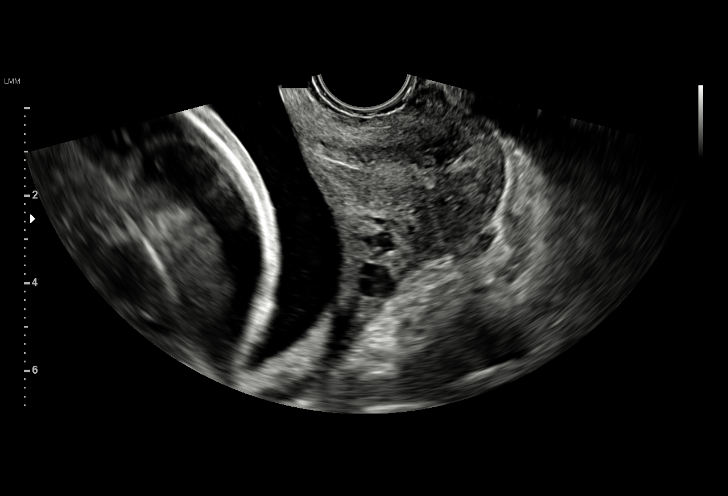
[im 12/14]
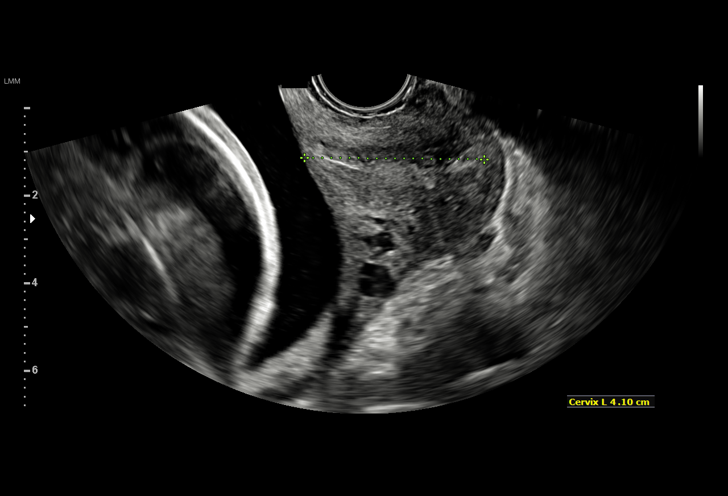
[im 13/14]
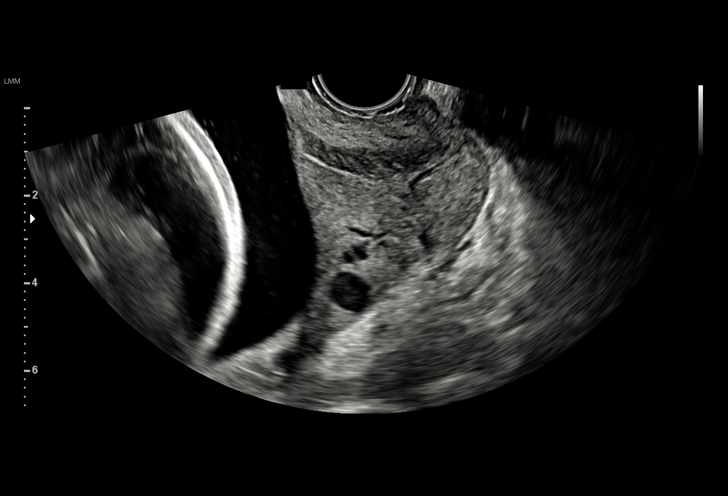
[im 14/14]
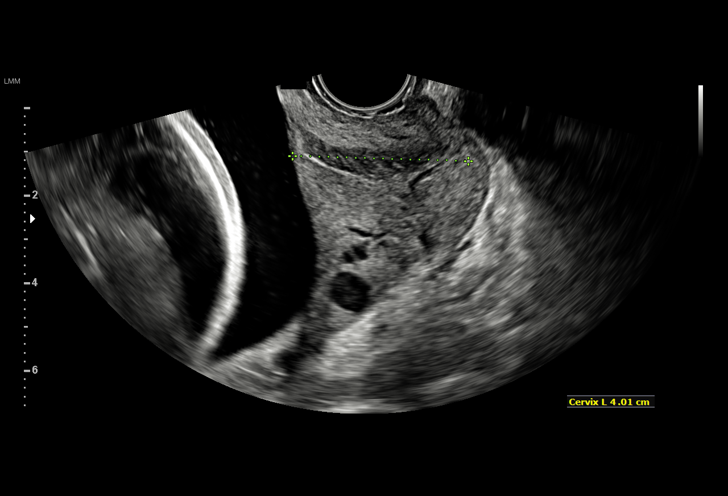

[14 of 14 positions shown; findings below may reference images not displayed]

Attending:        Joaquim Battles       Secondary Phy.:    [facility]-
MAU/Triage
BLAIN NP

1  REIMGARDAS RINGIENE           [phone]      [phone]     [phone]
Indications

25 weeks gestation of pregnancy
Preterm contractions
Encounter for cervical length
OB History

Gravidity:    1
Fetal Evaluation

Num Of Fetuses:     1
Fetal Heart         155
Rate(bpm):
Cardiac Activity:   Observed
Presentation:       Cephalic

Amniotic Fluid
AFI FV:      Subjectively within normal limits

Largest Pocket(cm)
5.1
Gestational Age

Clinical EDD:  25w 6d                                        EDD:   10/15/17
Best:          25w 6d     Det. By:  Clinical EDD             EDD:   10/15/17
Cervix Uterus Adnexa

Cervix
Length:           3.98  cm.
Normal appearance by transvaginal scan
Uterus
No abnormality visualized.

Cul De Sac:   No free fluid seen.
Impression

SIUP at 07w8d (remote read only)
cephalic presentation
active fetus
cervix is long and closed (3.98cm in closed functional length)
Recommendations

Follow up as clinically indicated.

## 2020-05-20 LAB — OB RESULTS CONSOLE HEPATITIS B SURFACE ANTIGEN: Hepatitis B Surface Ag: NEGATIVE

## 2020-05-20 LAB — OB RESULTS CONSOLE RUBELLA ANTIBODY, IGM: Rubella: IMMUNE

## 2020-05-20 LAB — OB RESULTS CONSOLE HIV ANTIBODY (ROUTINE TESTING): HIV: NONREACTIVE

## 2020-11-13 NOTE — L&D Delivery Note (Signed)
Delivery Note Sunshine Mackowski is a G2P1001 at [redacted]w[redacted]d who had a spontaneous delivery at 1512, a viable female was delivered via occiput posterior.  APGAR: 8 , 9; weight 6lb 15.6oz.    Admitted for PROM, augmented with pitocin. Progressed normally. Pushed for 8 minutes. Baby was delivered without difficulty. No nuchal cord.   Cord clamped and cut after 60 seconds. Placenta delivered spontaneously and intact.  2nd degree perineal laceration repaired with 2-0 and 3-0 vicryl in usual fashion. Additional hemostatic stitches required. Excellent hemostasis obtained.   IM methergine administered for lower uterine segment atony with good response.   Placenta: to L&D for disposal Anesthesia:  epidural Episiotomy:  none Lacerations:  2nd degree perineal Suture Repair: 2.0 3.0 vicryl Est. Blood Loss (mL):  250  Mom to postpartum.  Baby to Couplet care / Skin to Skin.  Charlett Nose 11/23/2020, 4:00 PM

## 2020-11-13 NOTE — L&D Delivery Note (Signed)
LC visited with mom and family in labor and delivery.  Infant cuing.  Was speaking with mom a few minutes.  Mom reports she breastfed her first child for 11/2 years.  No challenges except she had very sore nipples at first. Dr Ezequiel Essex came in to assess baby.  Mom started vomiting.  Continued to vomit.  Praised her decision to breastfeed.  Urged family to call lactation as needed.

## 2020-11-23 ENCOUNTER — Inpatient Hospital Stay (HOSPITAL_COMMUNITY)
Admission: AD | Admit: 2020-11-23 | Discharge: 2020-11-24 | DRG: 807 | Disposition: A | Discharge status: Home / Self Care | Payer: Medicaid Other | Attending: Obstetrics and Gynecology | Admitting: Obstetrics and Gynecology

## 2020-11-23 ENCOUNTER — Other Ambulatory Visit: Payer: Self-pay

## 2020-11-23 ENCOUNTER — Encounter (HOSPITAL_COMMUNITY): Payer: Self-pay | Admitting: Obstetrics

## 2020-11-23 ENCOUNTER — Inpatient Hospital Stay (HOSPITAL_COMMUNITY): Payer: Medicaid Other | Admitting: Anesthesiology

## 2020-11-23 DIAGNOSIS — O4200 Premature rupture of membranes, onset of labor within 24 hours of rupture, unspecified weeks of gestation: Secondary | ICD-10-CM | POA: Diagnosis present

## 2020-11-23 DIAGNOSIS — Z3A37 37 weeks gestation of pregnancy: Secondary | ICD-10-CM | POA: Diagnosis not present

## 2020-11-23 DIAGNOSIS — Z20822 Contact with and (suspected) exposure to covid-19: Secondary | ICD-10-CM | POA: Diagnosis present

## 2020-11-23 DIAGNOSIS — O26893 Other specified pregnancy related conditions, third trimester: Secondary | ICD-10-CM | POA: Diagnosis present

## 2020-11-23 DIAGNOSIS — O4292 Full-term premature rupture of membranes, unspecified as to length of time between rupture and onset of labor: Principal | ICD-10-CM | POA: Diagnosis present

## 2020-11-23 DIAGNOSIS — O99824 Streptococcus B carrier state complicating childbirth: Secondary | ICD-10-CM | POA: Diagnosis present

## 2020-11-23 LAB — TYPE AND SCREEN
ABO/RH(D): O POS
Antibody Screen: NEGATIVE

## 2020-11-23 LAB — CBC
HCT: 29 % — ABNORMAL LOW (ref 36.0–46.0)
Hemoglobin: 9.9 g/dL — ABNORMAL LOW (ref 12.0–15.0)
MCH: 28.6 pg (ref 26.0–34.0)
MCHC: 34.1 g/dL (ref 30.0–36.0)
MCV: 83.8 fL (ref 80.0–100.0)
Platelets: 367 10*3/uL (ref 150–400)
RBC: 3.46 MIL/uL — ABNORMAL LOW (ref 3.87–5.11)
RDW: 12.8 % (ref 11.5–15.5)
WBC: 10.6 10*3/uL — ABNORMAL HIGH (ref 4.0–10.5)
nRBC: 0 % (ref 0.0–0.2)

## 2020-11-23 LAB — POCT FERN TEST: POCT Fern Test: POSITIVE

## 2020-11-23 LAB — RESP PANEL BY RT-PCR (FLU A&B, COVID) ARPGX2
Influenza A by PCR: NEGATIVE
Influenza B by PCR: NEGATIVE
SARS Coronavirus 2 by RT PCR: NEGATIVE

## 2020-11-23 LAB — RPR: RPR Ser Ql: NONREACTIVE

## 2020-11-23 LAB — GROUP B STREP BY PCR: Group B strep by PCR: POSITIVE — AB

## 2020-11-23 MED ORDER — LACTATED RINGERS IV SOLN: INTRAVENOUS | Status: DC

## 2020-11-23 MED ORDER — ACETAMINOPHEN 325 MG PO TABS: 650.0000 mg | ORAL_TABLET | ORAL | Status: DC | PRN

## 2020-11-23 MED ORDER — TERBUTALINE SULFATE 1 MG/ML IJ SOLN: 0.2500 mg | Freq: Once | INTRAMUSCULAR | Status: DC | PRN

## 2020-11-23 MED ORDER — LACTATED RINGERS IV SOLN: 500.0000 mL | Freq: Once | INTRAVENOUS | Status: DC

## 2020-11-23 MED ORDER — LIDOCAINE HCL (PF) 1 % IJ SOLN
INTRAMUSCULAR | Status: DC | PRN
  Administered 2020-11-23: 10 mL via EPIDURAL

## 2020-11-23 MED ORDER — IBUPROFEN 600 MG PO TABS: 600.0000 mg | ORAL_TABLET | Freq: Four times a day (QID) | ORAL | Status: DC

## 2020-11-23 MED ORDER — OXYCODONE-ACETAMINOPHEN 5-325 MG PO TABS: 2.0000 | ORAL_TABLET | ORAL | Status: DC | PRN

## 2020-11-23 MED ORDER — SIMETHICONE 80 MG PO CHEW: 80.0000 mg | CHEWABLE_TABLET | ORAL | Status: DC | PRN

## 2020-11-23 MED ORDER — PHENYLEPHRINE 40 MCG/ML (10ML) SYRINGE FOR IV PUSH (FOR BLOOD PRESSURE SUPPORT): 80.0000 ug | PREFILLED_SYRINGE | INTRAVENOUS | Status: DC | PRN

## 2020-11-23 MED ORDER — LACTATED RINGERS IV SOLN
500.0000 mL | INTRAVENOUS | Status: DC | PRN
  Administered 2020-11-23: 500 mL via INTRAVENOUS

## 2020-11-23 MED ORDER — DIPHENHYDRAMINE HCL 50 MG/ML IJ SOLN: 12.5000 mg | INTRAMUSCULAR | Status: DC | PRN

## 2020-11-23 MED ORDER — FLEET ENEMA 7-19 GM/118ML RE ENEM: 1.0000 | ENEMA | Freq: Every day | RECTAL | Status: DC | PRN

## 2020-11-23 MED ORDER — OXYTOCIN BOLUS FROM INFUSION
333.0000 mL | Freq: Once | INTRAVENOUS | Status: AC
  Administered 2020-11-23: 333 mL via INTRAVENOUS

## 2020-11-23 MED ORDER — COCONUT OIL OIL: 1.0000 "application " | TOPICAL_OIL | Status: DC | PRN

## 2020-11-23 MED ORDER — SOD CITRATE-CITRIC ACID 500-334 MG/5ML PO SOLN
30.0000 mL | ORAL | Status: DC | PRN
  Administered 2020-11-23: 30 mL via ORAL
  Filled 2020-11-23: qty 15

## 2020-11-23 MED ORDER — SODIUM CHLORIDE (PF) 0.9 % IJ SOLN
INTRAMUSCULAR | Status: DC | PRN
  Administered 2020-11-23: 12 mL/h via EPIDURAL

## 2020-11-23 MED ORDER — ONDANSETRON HCL 4 MG PO TABS: 4.0000 mg | ORAL_TABLET | ORAL | Status: DC | PRN

## 2020-11-23 MED ORDER — SENNOSIDES-DOCUSATE SODIUM 8.6-50 MG PO TABS
2.0000 | ORAL_TABLET | Freq: Every day | ORAL | Status: DC
  Administered 2020-11-24: 2 via ORAL
  Filled 2020-11-23: qty 2

## 2020-11-23 MED ORDER — SODIUM CHLORIDE 0.9 % IV SOLN
5.0000 10*6.[IU] | Freq: Once | INTRAVENOUS | Status: AC
  Administered 2020-11-23: 5 10*6.[IU] via INTRAVENOUS
  Filled 2020-11-23: qty 5

## 2020-11-23 MED ORDER — ONDANSETRON HCL 4 MG/2ML IJ SOLN
4.0000 mg | Freq: Four times a day (QID) | INTRAMUSCULAR | Status: DC | PRN
  Administered 2020-11-23: 4 mg via INTRAVENOUS
  Filled 2020-11-23: qty 2

## 2020-11-23 MED ORDER — EPHEDRINE 5 MG/ML INJ: 10.0000 mg | INTRAVENOUS | Status: DC | PRN

## 2020-11-23 MED ORDER — METHYLERGONOVINE MALEATE 0.2 MG/ML IJ SOLN
INTRAMUSCULAR | Status: AC
  Administered 2020-11-23: 0.2 mg
  Filled 2020-11-23: qty 1

## 2020-11-23 MED ORDER — BISACODYL 10 MG RE SUPP: 10.0000 mg | Freq: Every day | RECTAL | Status: DC | PRN

## 2020-11-23 MED ORDER — PENICILLIN G POT IN DEXTROSE 60000 UNIT/ML IV SOLN
3.0000 10*6.[IU] | INTRAVENOUS | Status: DC
  Administered 2020-11-23: 3 10*6.[IU] via INTRAVENOUS
  Filled 2020-11-23: qty 50

## 2020-11-23 MED ORDER — OXYTOCIN-SODIUM CHLORIDE 30-0.9 UT/500ML-% IV SOLN
1.0000 m[IU]/min | INTRAVENOUS | Status: DC
  Administered 2020-11-23: 2 m[IU]/min via INTRAVENOUS
  Filled 2020-11-23: qty 500

## 2020-11-23 MED ORDER — OXYCODONE HCL 5 MG PO TABS: 5.0000 mg | ORAL_TABLET | ORAL | Status: DC | PRN

## 2020-11-23 MED ORDER — LIDOCAINE HCL (PF) 1 % IJ SOLN: 30.0000 mL | INTRAMUSCULAR | Status: DC | PRN

## 2020-11-23 MED ORDER — OXYTOCIN-SODIUM CHLORIDE 30-0.9 UT/500ML-% IV SOLN
2.5000 [IU]/h | INTRAVENOUS | Status: DC
  Filled 2020-11-23: qty 500

## 2020-11-23 MED ORDER — WITCH HAZEL-GLYCERIN EX PADS: 1.0000 "application " | MEDICATED_PAD | CUTANEOUS | Status: DC | PRN

## 2020-11-23 MED ORDER — METHYLERGONOVINE MALEATE 0.2 MG PO TABS
0.2000 mg | ORAL_TABLET | ORAL | Status: DC | PRN
Start: 2020-11-23 — End: 2020-11-24

## 2020-11-23 MED ORDER — FENTANYL-BUPIVACAINE-NACL 0.5-0.125-0.9 MG/250ML-% EP SOLN
12.0000 mL/h | EPIDURAL | Status: DC | PRN
  Filled 2020-11-23: qty 250

## 2020-11-23 MED ORDER — DIBUCAINE (PERIANAL) 1 % EX OINT: 1.0000 "application " | TOPICAL_OINTMENT | CUTANEOUS | Status: DC | PRN

## 2020-11-23 MED ORDER — ONDANSETRON HCL 4 MG/2ML IJ SOLN: 4.0000 mg | INTRAMUSCULAR | Status: DC | PRN

## 2020-11-23 MED ORDER — ACETAMINOPHEN 325 MG PO TABS
650.0000 mg | ORAL_TABLET | ORAL | Status: DC | PRN
  Administered 2020-11-23 – 2020-11-24 (×2): 650 mg via ORAL
  Filled 2020-11-23 (×3): qty 2

## 2020-11-23 MED ORDER — OXYCODONE-ACETAMINOPHEN 5-325 MG PO TABS: 1.0000 | ORAL_TABLET | ORAL | Status: DC | PRN

## 2020-11-23 MED ORDER — FENTANYL CITRATE (PF) 100 MCG/2ML IJ SOLN: 50.0000 ug | INTRAMUSCULAR | Status: DC | PRN

## 2020-11-23 MED ORDER — BENZOCAINE-MENTHOL 20-0.5 % EX AERO
1.0000 | INHALATION_SPRAY | CUTANEOUS | Status: DC | PRN
Start: 2020-11-23 — End: 2020-11-24
  Administered 2020-11-23: 1 via TOPICAL
  Filled 2020-11-23: qty 56

## 2020-11-23 MED ORDER — METHYLERGONOVINE MALEATE 0.2 MG/ML IJ SOLN: 0.2000 mg | INTRAMUSCULAR | Status: DC | PRN

## 2020-11-23 MED ORDER — OXYCODONE HCL 5 MG PO TABS: 10.0000 mg | ORAL_TABLET | ORAL | Status: DC | PRN

## 2020-11-23 MED ORDER — PRENATAL MULTIVITAMIN CH
1.0000 | ORAL_TABLET | Freq: Every day | ORAL | Status: DC
  Administered 2020-11-24: 1 via ORAL
  Filled 2020-11-23: qty 1

## 2020-11-23 MED ORDER — ZOLPIDEM TARTRATE 5 MG PO TABS: 5.0000 mg | ORAL_TABLET | Freq: Every evening | ORAL | Status: DC | PRN

## 2020-11-23 NOTE — H&P (Signed)
30 y.o. G2P1001 @ [redacted]w[redacted]d presents with leakage of fluid and intermittent contractions.  On exam, she was grossly ruptured with positive fern per MAU CNM.  Otherwise has good fetal movement and no bleeding.   Her pregnancy has been uncomplicated  Past Medical History:  Diagnosis Date  . Medical history non-contributory     Past Surgical History:  Procedure Laterality Date  . NO PAST SURGERIES      OB History  Gravida Para Term Preterm AB Living  2 1 1     1   SAB IAB Ectopic Multiple Live Births        0 1    # Outcome Date GA Lbr Len/2nd Weight Sex Delivery Anes PTL Lv  2 Current           1 Term 10/16/17 [redacted]w[redacted]d 16:01 / 01:29 3410 g M Vag-Spont EPI, Local  LIV    Social History   Socioeconomic History  . Marital status: Married    Spouse name: Not on file  . Number of children: Not on file  . Years of education: Not on file  . Highest education level: Not on file  Occupational History  . Not on file  Tobacco Use  . Smoking status: Never Smoker  . Smokeless tobacco: Never Used  Substance and Sexual Activity  . Alcohol use: No  . Drug use: No  . Sexual activity: Not Currently  Other Topics Concern  . Not on file  Social History Narrative  . Not on file      Patient has no known allergies.    Prenatal Transfer Tool  Maternal Diabetes: No Genetic Screening: Declined Maternal Ultrasounds/Referrals: Normal Fetal Ultrasounds or other Referrals:  None Maternal Substance Abuse:  No Significant Maternal Medications:  None Significant Maternal Lab Results: Group B Strep positive     Vitals:   11/23/20 0419 11/23/20 0439  BP: 110/75 124/85  Pulse: 100 88  Resp: 20 20  Temp: 98.4 F (36.9 C) 98.2 F (36.8 C)  SpO2:  100%     General:  NAD Abdomen:  soft, gravid SVE:  3/50/-3 /cephalic per CNM FHTs:  130s, moderate variability, + accelerations, no decelerations Toco:  q2-6 minutes  Growth 01/21/21: 12/15 at [redacted]w[redacted]d--EFW 4lb 11oz (45%)  A/P   30 y.o. G2P1001 [redacted]w[redacted]d  presents with rupture of membranes Admit to L&D Pitocin augmentation as needed FSR/ vtx/ GBS positive in urine--PCN  [facility]

## 2020-11-23 NOTE — Lactation Note (Signed)
This note was copied from a baby's chart. Lactation Consultation Note  Patient Name: Jamie Kerr TAVWP'V Date: 11/23/2020 Reason for consult: L&D Initial assessment;Early term 37-38.6wks;Other (Comment) (see LC lactation note/mom too sick to latch baby) Age:29 hours  Maternal Data Formula Feeding for Exclusion: No Does the patient have breastfeeding experience prior to this delivery?: Yes  Feeding    LATCH Score                   Interventions    Lactation Tools Discussed/Used     Consult Status Consult Status: Follow-up Date: 11/23/20 Follow-up type: In-patient    [facility] Michaelle Copas 11/23/2020, 4:51 PM

## 2020-11-23 NOTE — Progress Notes (Signed)
Pt emptied bladder at 1830.  Left leg heavy, stedy to bathroom.    No Ibuprofen, family at bedside state MD that pt sees for her RA does not want her taking Ibuprofen.  Will ask RN for Tylenol prn.

## 2020-11-23 NOTE — Anesthesia Procedure Notes (Signed)
Epidural Patient location during procedure: OB Start time: 11/23/2020 9:50 AM End time: 11/23/2020 9:57 AM  Staffing Anesthesiologist: Mellody Dance, MD Performed: anesthesiologist   Preanesthetic Checklist Completed: patient identified, IV checked, site marked, risks and benefits discussed, monitors and equipment checked, pre-op evaluation and timeout performed  Epidural Patient position: sitting Prep: DuraPrep Patient monitoring: heart rate, cardiac monitor, continuous pulse ox and blood pressure Approach: midline Location: L2-L3 Injection technique: LOR saline  Needle:  Needle type: Tuohy  Needle gauge: 17 G Needle length: 9 cm Needle insertion depth: 5 cm Catheter type: closed end flexible Catheter size: 20 Guage Catheter at skin depth: 9 cm Test dose: negative and Other  Assessment Events: blood not aspirated, injection not painful, no injection resistance and negative IV test  Additional Notes Informed consent obtained prior to proceeding including risk of failure, 1% risk of PDPH, risk of minor discomfort and bruising.  Discussed rare but serious complications including epidural abscess, permanent nerve injury, epidural hematoma.  Discussed alternatives to epidural analgesia and patient desires to proceed.  Timeout performed pre-procedure verifying patient name, procedure, and platelet count.  Patient tolerated procedure well.

## 2020-11-23 NOTE — Progress Notes (Signed)
Upon entering room, patient standing in room with small amount of blood on floor.  Walked to bathroom, voided and walked back to bed.  Cervical check done and no further beeding noted at this time.Will continue to monitor.

## 2020-11-23 NOTE — MAU Provider Note (Signed)
S: Ms. Jamie Kerr is a 29 y.o. G2P1001 at [redacted]w[redacted]d  who presents to MAU today complaining contractions q 5-10 minutes since overnight. She denies vaginal bleeding. She endorses LOF. She reports normal fetal movement.    O: BP 124/85 (BP Location: Right Arm)   Pulse 88   Temp 98.2 F (36.8 C) (Oral)   Resp 20   Ht 5\' 3"  (1.6 m)   Wt 149 lb 9.6 oz (67.9 kg)   SpO2 100%   BMI 26.50 kg/m  GENERAL: Well-developed, well-nourished female in no acute distress.  HEAD: Normocephalic, atraumatic.  CHEST: Normal effort of breathing, regular heart rate ABDOMEN: Soft, nontender, gravid  Cervical exam:  Dilation: 3 Effacement (%): 50 Cervical Position: Posterior Station: -3 Presentation: Vertex Exam by:: [email or web address], CNM   Fetal Monitoring: reactive Baseline: 140 Variability: moderate Accelerations: present Decelerations: none Contractions: q5-51min  Grossly ruptured membranes on speculum exam, fern test positive  A: SIUP at [redacted]w[redacted]d  Active labor with SROM  P: Admit to L&D, RN called report to community OB   [redacted]w[redacted]d, Bernerd Limbo 11/23/2020 6:30 AM

## 2020-11-23 NOTE — MAU Note (Signed)
PT SAYS SROM AT 0335.  NO UC'S.  PNC WITH  DR TAAM.  VE 3 CM ON YESTERDAY    DENIES HSV , UNSURE OF GBS-

## 2020-11-23 NOTE — Progress Notes (Signed)
Labor Progress Note  S: Patient comfortable with epidural  SVE 6/80/0, forebag ruptured for blood tinged fluid  FHR 125bpm, moderate variability, + accels, no decels. + scalp stim with exam Toco: ctx q 2 min  A/P 29Y G2P1001 @ [redacted]w[redacted]d, PROM, now active labor - continue pitocin, currently at 45mu/min - GBS+: cont penicillin - pain control: epidural  M. Timothy Lasso, MD 11/23/20 12:15 PM

## 2020-11-23 NOTE — Anesthesia Preprocedure Evaluation (Signed)
Anesthesia Evaluation  Patient identified by MRN, date of birth, ID band Patient awake    Reviewed: Allergy & Precautions, H&P , NPO status , Patient's Chart, lab work & pertinent test results  History of Anesthesia Complications Negative for: history of anesthetic complications  Airway Mallampati: II  TM Distance: >3 FB Neck ROM: full    Dental no notable dental hx. (+) Teeth Intact   Pulmonary neg pulmonary ROS,    Pulmonary exam normal breath sounds clear to auscultation       Cardiovascular negative cardio ROS Normal cardiovascular exam Rhythm:regular Rate:Normal     Neuro/Psych negative neurological ROS  negative psych ROS   GI/Hepatic negative GI ROS, Neg liver ROS,   Endo/Other  negative endocrine ROS  Renal/GU negative Renal ROS  negative genitourinary   Musculoskeletal   Abdominal   Peds  Hematology  (+) anemia ,   Anesthesia Other Findings   Reproductive/Obstetrics (+) Pregnancy                             Anesthesia Physical  Anesthesia Plan  ASA: II  Anesthesia Plan: Epidural   Post-op Pain Management:    Induction:   PONV Risk Score and Plan:   Airway Management Planned:   Additional Equipment:   Intra-op Plan:   Post-operative Plan:   Informed Consent: I have reviewed the patients History and Physical, chart, labs and discussed the procedure including the risks, benefits and alternatives for the proposed anesthesia with the patient or authorized representative who has indicated his/her understanding and acceptance.       Plan Discussed with: Anesthesiologist  Anesthesia Plan Comments:         Anesthesia Quick Evaluation

## 2020-11-24 LAB — CBC
HCT: 26.2 % — ABNORMAL LOW (ref 36.0–46.0)
Hemoglobin: 8.3 g/dL — ABNORMAL LOW (ref 12.0–15.0)
MCH: 27.2 pg (ref 26.0–34.0)
MCHC: 31.7 g/dL (ref 30.0–36.0)
MCV: 85.9 fL (ref 80.0–100.0)
Platelets: 322 10*3/uL (ref 150–400)
RBC: 3.05 MIL/uL — ABNORMAL LOW (ref 3.87–5.11)
RDW: 12.8 % (ref 11.5–15.5)
WBC: 17.2 10*3/uL — ABNORMAL HIGH (ref 4.0–10.5)
nRBC: 0 % (ref 0.0–0.2)

## 2020-11-24 MED ORDER — FERROUS SULFATE 325 (65 FE) MG PO TABS
325.0000 mg | ORAL_TABLET | Freq: Every day | ORAL | Status: DC
Start: 2020-11-24 — End: 2020-11-24
  Administered 2020-11-24: 325 mg via ORAL
  Filled 2020-11-24: qty 1

## 2020-11-24 NOTE — Discharge Summary (Signed)
Postpartum Discharge Summary    Patient Name: Jamie Kerr DOB: 09/27/1991 MRN: [id number]  Date of admission: 11/23/2020 Delivery date:11/23/2020  Delivering provider: Irene Pap E  Date of discharge: 11/24/2020  Admitting diagnosis: Premature rupture of membranes with onset of labor within 24 hours of rupture [O42.00] Intrauterine pregnancy: [redacted]w[redacted]d     Secondary diagnosis:  Active Problems:   Premature rupture of membranes with onset of labor within 24 hours of rupture  Additional problems: none    Discharge diagnosis: Term Pregnancy Delivered                                              Post partum procedures:none Augmentation: Pitocin Complications: None  Hospital course: Onset of Labor With Vaginal Delivery      30 y.o. yo [id number] at [redacted]w[redacted]d was admitted in Latent Labor on 11/23/2020. Patient had an uncomplicated labor course as follows:  Membrane Rupture Time/Date: 3:30 AM ,11/23/2020   Delivery Method:Vaginal, Spontaneous  Episiotomy: None  Lacerations:    Patient had an uncomplicated postpartum course.  She is ambulating, tolerating a regular diet, passing flatus, and urinating well. Patient is discharged home in stable condition on 11/24/20.  Newborn Data: Birth date:11/23/2020  Birth time:3:12 PM  Gender:Female  Living status:Living  Apgars:8 ,9  Weight:3164 g   Magnesium Sulfate received: No BMZ received: No Rhophylac:No MMR:N/A T-DaP:see office note Flu: N/A Transfusion:No  Physical exam  Vitals:   11/23/20 2241 11/24/20 0156 11/24/20 0555 11/24/20 1448  BP: 112/78 109/76 (!) 93/59 (!) 92/58  Pulse: 96 68 76 73  Resp: $Remo'18 18 18 16  'AwhWy$ Temp: 99.3 F (37.4 C) 99.2 F (37.3 C) 98.8 F (37.1 C) 98.4 F (36.9 C)  TempSrc: Oral Oral Oral Oral  SpO2: 99% 99% 99% 99%  Weight:      Height:       General: alert, cooperative and no distress Lochia: appropriate Uterine Fundus: firm DVT Evaluation: No evidence of DVT seen on physical exam. Labs: Lab  Results  Component Value Date   WBC 17.2 (H) 11/24/2020   HGB 8.3 (L) 11/24/2020   HCT 26.2 (L) 11/24/2020   MCV 85.9 11/24/2020   PLT 322 11/24/2020   No flowsheet data found. Edinburgh Score: Edinburgh Postnatal Depression Scale Screening Tool 11/23/2020  I have been able to laugh and see the funny side of things. (No Data)      After visit meds:  Allergies as of 11/24/2020   No Known Allergies     Medication List    You have not been prescribed any medications.      Discharge home in stable condition Infant Feeding: Breast Infant Disposition:home with mother Discharge instruction: per After Visit Summary and Postpartum booklet. Activity: Advance as tolerated. Pelvic rest for 6 weeks.  Diet: routine diet Anticipated Birth Control: Unsure Postpartum Appointment:4 weeks Additional Postpartum F/U: Postpartum Depression checkup Future Appointments:No future appointments. Follow up Visit:  Follow-up Information    Rowland Lathe, MD Follow up in 4 week(s).   Specialty: Obstetrics and Gynecology Contact information: [address] [place] [address] [phone]                   11/24/2020 Allyn Kenner, DO

## 2020-11-24 NOTE — Anesthesia Postprocedure Evaluation (Signed)
Anesthesia Post Note  Patient: Kirsten Spearing  Procedure(s) Performed: AN AD HOC LABOR EPIDURAL     Patient location during evaluation: Mother Baby Anesthesia Type: Epidural Level of consciousness: awake and alert Pain management: pain level controlled Vital Signs Assessment: post-procedure vital signs reviewed and stable Respiratory status: spontaneous breathing, nonlabored ventilation and respiratory function stable Cardiovascular status: stable Postop Assessment: no headache, no backache, epidural receding, no apparent nausea or vomiting, patient able to bend at knees, adequate PO intake and able to ambulate Anesthetic complications: no   No complications documented.  Last Vitals:  Vitals:   11/24/20 0156 11/24/20 0555  BP: 109/76 (!) 93/59  Pulse: 68 76  Resp: 18 18  Temp: 37.3 C 37.1 C  SpO2: 99% 99%    Last Pain:  Vitals:   11/24/20 0555  TempSrc: Oral  PainSc: 0-No pain   Pain Goal:                   [employer]

## 2020-11-24 NOTE — Progress Notes (Signed)
Pt ambulated to bathroom with slow and steady gait.  Denies pain. Voided quantity sufficient and without difficulty. Peri care given with instruction.  Verbalizes understanding.  Back to bed without incident.

## 2023-11-14 NOTE — L&D Delivery Note (Signed)
 Delivery Note Patient progressed along a normal labor curve without augmentation. She pushed for about 10 minutes, but then had significant vomiting.  She received zofran  and requested a rest before resuming pushing.  She then pushed for another 2 hours. At 6:28 AM a viable female was delivered via Vaginal, Spontaneous (Presentation:  Left Occiput Posterior).  APGAR: 8, 9; weight 7 lb 9oz (3430 gm) .   Placenta status: Spontaneous, Intact.  Cord: 3 vessels with the following complications: None.  Cord pH: n/a  Anesthesia: Epidural Episiotomy: None Lacerations: Perineal;2nd degree Suture Repair: 2.0 vicryl rapide Est. Blood Loss (mL):  150 mL  Mom to postpartum.  Baby to Couplet care / Skin to Skin.  Preet Mangano [facility] Biff Rutigliano 10/01/2024, 7:00 AM

## 2024-03-11 LAB — OB RESULTS CONSOLE HIV ANTIBODY (ROUTINE TESTING): HIV: NONREACTIVE

## 2024-03-11 LAB — OB RESULTS CONSOLE RUBELLA ANTIBODY, IGM: Rubella: IMMUNE

## 2024-03-11 LAB — OB RESULTS CONSOLE GC/CHLAMYDIA
Chlamydia: NEGATIVE
Neisseria Gonorrhea: NEGATIVE

## 2024-03-11 LAB — OB RESULTS CONSOLE HEPATITIS B SURFACE ANTIGEN: Hepatitis B Surface Ag: NEGATIVE

## 2024-05-26 ENCOUNTER — Other Ambulatory Visit: Payer: Self-pay | Admitting: Obstetrics

## 2024-05-26 DIAGNOSIS — O283 Abnormal ultrasonic finding on antenatal screening of mother: Secondary | ICD-10-CM
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
## 2024-06-23 DIAGNOSIS — O283 Abnormal ultrasonic finding on antenatal screening of mother: Secondary | ICD-10-CM | POA: Insufficient documentation

## 2024-06-23 DIAGNOSIS — M069 Rheumatoid arthritis, unspecified: Secondary | ICD-10-CM | POA: Insufficient documentation

## 2024-07-01 ENCOUNTER — Ambulatory Visit: Attending: Obstetrics

## 2024-07-01 ENCOUNTER — Ambulatory Visit (HOSPITAL_BASED_OUTPATIENT_CLINIC_OR_DEPARTMENT_OTHER): Admitting: Obstetrics and Gynecology

## 2024-07-01 VITALS — BP 113/72 | HR 110

## 2024-07-01 DIAGNOSIS — M0570 Rheumatoid arthritis with rheumatoid factor of unspecified site without organ or systems involvement: Secondary | ICD-10-CM | POA: Diagnosis not present

## 2024-07-01 DIAGNOSIS — M069 Rheumatoid arthritis, unspecified: Secondary | ICD-10-CM | POA: Diagnosis present

## 2024-07-01 DIAGNOSIS — O99891 Other specified diseases and conditions complicating pregnancy: Secondary | ICD-10-CM

## 2024-07-01 DIAGNOSIS — O283 Abnormal ultrasonic finding on antenatal screening of mother: Secondary | ICD-10-CM | POA: Diagnosis present

## 2024-07-01 DIAGNOSIS — Z3A25 25 weeks gestation of pregnancy: Secondary | ICD-10-CM | POA: Diagnosis not present

## 2024-07-01 NOTE — Progress Notes (Signed)
 Maternal-Fetal Medicine Consultation Name: Jamie Kerr MRN: [id number]  G3 P2002 at 25w 4d gestation.  Patient is here for a fetal anatomy scan.  She had opted not to screen for fetal aneuploidies. At your office ultrasound, mild pleural effusion was suspected.  Past medical history significant for rheumatoid arthritis.  Patient is being followed by her rheumatologist at [facility].  She takes Plaquenil 200 mg daily. She does not report active arthralgia and does not have flares in this pregnancy.  Obstetric history significant for 2 term vaginal deliveries.  Both her children all good health.  Patient agreed for a female physician to perform ultrasound.  Ultrasound We performed a fetal anatomical survey.  Amniotic fluid normal good fetal activity seen.  Fetal biometry is consistent with a previously established date.  No markers of aneuploidies or obvious fetal structural defects are seen. Four-chamber view and both lungs appear normal with no evidence of pleural effusion. She understands the limitations of ultrasound in detecting fetal anomalies.  I reassured the patient of normal ultrasound findings and the absence of pleural effusion (with help of ultrasound images).  Rheumatoid arthritis in pregnancy I briefly discussed rheumatoid arthritis.  Most women with RA reported remarkable improvement during pregnancy, which occurs because of a change in immune status.  About 15% have worsening symptoms during pregnancy.  Flares are more common and postpartum period. I reassured the patient that the hydroxychloroquine can be safely taken in pregnancy.  Fetal congenital malformations are not increased. I have recommended fetal growth assessments every 4 weeks. Vaginal delivery can be safely attempted in pregnancy.   Recommendations -Fetal growth assessments every 4 weeks until delivery and they may be performed at your office.  Consultation including face-to-face (more than 50%) counseling  30 minutes.

## 2024-08-08 ENCOUNTER — Other Ambulatory Visit (HOSPITAL_COMMUNITY): Payer: Self-pay | Admitting: Obstetrics and Gynecology

## 2024-08-08 DIAGNOSIS — D508 Other iron deficiency anemias: Secondary | ICD-10-CM | POA: Insufficient documentation

## 2024-08-11 ENCOUNTER — Telehealth (HOSPITAL_COMMUNITY): Payer: Self-pay | Admitting: Pharmacy Technician

## 2024-08-11 NOTE — Telephone Encounter (Signed)
 Auth Submission: NO AUTH NEEDED Site of care: [facility] Payer: [facility] [place] MEDICAID Medication & CPT/J Code(s) submitted: Feraheme (ferumoxytol) [id number] Diagnosis Code: D50.8 Route of submission (phone, fax, portal):  Phone # Fax # Auth type: Buy/Bill HB Units/visits requested: 510mg  x 2 doses Reference number:  Approval from: 08/11/24 to 11/12/24      Dagoberto Armour, CPhT [facility] Phone: [phone] 08/11/2024

## 2024-08-20 ENCOUNTER — Ambulatory Visit (HOSPITAL_COMMUNITY)
Admission: RE | Admit: 2024-08-20 | Discharge: 2024-08-20 | Disposition: A | Discharge status: Home / Self Care | Source: Ambulatory Visit | Attending: Internal Medicine | Admitting: Internal Medicine

## 2024-08-20 VITALS — BP 99/54 | HR 90 | Temp 98.8°F | Resp 16

## 2024-08-20 DIAGNOSIS — D508 Other iron deficiency anemias: Secondary | ICD-10-CM | POA: Insufficient documentation

## 2024-08-20 MED ORDER — SODIUM CHLORIDE 0.9 % IV SOLN: INTRAVENOUS | Status: DC | PRN

## 2024-08-20 MED ORDER — SODIUM CHLORIDE 0.9 % IV SOLN
510.0000 mg | Freq: Once | INTRAVENOUS | Status: AC
  Administered 2024-08-20: 510 mg via INTRAVENOUS
  Filled 2024-08-20: qty 17

## 2024-08-20 NOTE — Progress Notes (Signed)
 PATIENT CARE CENTER NOTE:  Diagnosis: Iron deficiency anemia secondary to inadequate dietary iron intake [D50.8]   Provider: Marjorie Gull MD  Procedure: Allegra 510mg  infusion   Patient received Feraheme ( #1 of 2) via PIV per therapy plan orders.  No premeds required.  Observed for at least 30 minutes post infusion. Tolerated well, no adverse reaction noted, vitals stable, discharge instructions given , verbalized understanding. Pt to RTC on 10/15, reviewed date and time with pt, verbalized understanding. Patient alert, oriented, and ambulatory at the time of discharge.

## 2024-08-27 ENCOUNTER — Ambulatory Visit (HOSPITAL_COMMUNITY)
Admission: RE | Admit: 2024-08-27 | Source: Ambulatory Visit | Attending: Internal Medicine | Admitting: Internal Medicine

## 2024-08-29 NOTE — Telephone Encounter (Signed)
 Appt. Was reschedule, patient was notified

## 2024-09-03 ENCOUNTER — Ambulatory Visit (HOSPITAL_COMMUNITY): Admission: RE | Admit: 2024-09-03 | Discharge: 2024-09-03 | Attending: Internal Medicine

## 2024-09-03 VITALS — BP 103/62 | HR 90 | Temp 98.2°F | Resp 16

## 2024-09-03 DIAGNOSIS — D508 Other iron deficiency anemias: Secondary | ICD-10-CM

## 2024-09-03 MED ORDER — SODIUM CHLORIDE 0.9 % IV SOLN
510.0000 mg | Freq: Once | INTRAVENOUS | Status: AC
  Administered 2024-09-03: 510 mg via INTRAVENOUS
  Filled 2024-09-03: qty 17

## 2024-09-03 MED ORDER — SODIUM CHLORIDE 0.9 % IV SOLN: INTRAVENOUS | Status: DC | PRN

## 2024-09-03 NOTE — Progress Notes (Signed)
 Diagnosis: Iron deficiency Anemia [D50.8]    Provider: Marjorie Gull MD    Procedure: Feraheme 510 mg Infusion    Note:  patient received Feraheme (2 fo 2) via PIV per therapy plan orders.  No premeds required. Observed for 30 minutes post infusion.  Tolerated well, no adverse reaction noted, vital signs stable upon discharge.  Patient verbalized understanding of discharge instructions and reaction signs to be aware of.  Pt alert and oriented, ambulatory upon leaving.

## 2024-09-19 LAB — OB RESULTS CONSOLE GBS: GBS: POSITIVE

## 2024-09-30 ENCOUNTER — Inpatient Hospital Stay (HOSPITAL_COMMUNITY)
Admission: AD | Admit: 2024-09-30 | Discharge: 2024-10-02 | DRG: 807 | Disposition: A | Attending: Obstetrics and Gynecology | Admitting: Obstetrics and Gynecology

## 2024-09-30 ENCOUNTER — Inpatient Hospital Stay (HOSPITAL_COMMUNITY): Admitting: Anesthesiology

## 2024-09-30 ENCOUNTER — Other Ambulatory Visit: Payer: Self-pay

## 2024-09-30 ENCOUNTER — Encounter (HOSPITAL_COMMUNITY): Payer: Self-pay | Admitting: Obstetrics

## 2024-09-30 DIAGNOSIS — O403XX0 Polyhydramnios, third trimester, not applicable or unspecified: Secondary | ICD-10-CM | POA: Diagnosis present

## 2024-09-30 DIAGNOSIS — O9902 Anemia complicating childbirth: Secondary | ICD-10-CM | POA: Diagnosis present

## 2024-09-30 DIAGNOSIS — Z3A38 38 weeks gestation of pregnancy: Secondary | ICD-10-CM | POA: Diagnosis not present

## 2024-09-30 DIAGNOSIS — O4292 Full-term premature rupture of membranes, unspecified as to length of time between rupture and onset of labor: Principal | ICD-10-CM | POA: Diagnosis present

## 2024-09-30 DIAGNOSIS — D509 Iron deficiency anemia, unspecified: Secondary | ICD-10-CM | POA: Diagnosis present

## 2024-09-30 DIAGNOSIS — O99824 Streptococcus B carrier state complicating childbirth: Secondary | ICD-10-CM | POA: Diagnosis present

## 2024-09-30 DIAGNOSIS — O4290 Premature rupture of membranes, unspecified as to length of time between rupture and onset of labor, unspecified weeks of gestation: Principal | ICD-10-CM | POA: Diagnosis present

## 2024-09-30 DIAGNOSIS — M069 Rheumatoid arthritis, unspecified: Secondary | ICD-10-CM | POA: Diagnosis present

## 2024-09-30 HISTORY — DX: Anemia, unspecified: D64.9

## 2024-09-30 LAB — CBC
HCT: 34.3 % — ABNORMAL LOW (ref 36.0–46.0)
Hemoglobin: 11.6 g/dL — ABNORMAL LOW (ref 12.0–15.0)
MCH: 29.7 pg (ref 26.0–34.0)
MCHC: 33.8 g/dL (ref 30.0–36.0)
MCV: 87.9 fL (ref 80.0–100.0)
Platelets: 319 K/uL (ref 150–400)
RBC: 3.9 MIL/uL (ref 3.87–5.11)
RDW: 15.9 % — ABNORMAL HIGH (ref 11.5–15.5)
WBC: 13.1 K/uL — ABNORMAL HIGH (ref 4.0–10.5)
nRBC: 0 % (ref 0.0–0.2)

## 2024-09-30 LAB — TYPE AND SCREEN
ABO/RH(D): O POS
Antibody Screen: NEGATIVE

## 2024-09-30 LAB — POCT FERN TEST: POCT Fern Test: POSITIVE

## 2024-09-30 MED ORDER — PHENYLEPHRINE 80 MCG/ML (10ML) SYRINGE FOR IV PUSH (FOR BLOOD PRESSURE SUPPORT): 80.0000 ug | PREFILLED_SYRINGE | INTRAVENOUS | Status: DC | PRN

## 2024-09-30 MED ORDER — SOD CITRATE-CITRIC ACID 500-334 MG/5ML PO SOLN
30.0000 mL | ORAL | Status: DC | PRN
Start: 2024-09-30 — End: 2024-10-01
  Administered 2024-10-01: 30 mL via ORAL
  Filled 2024-09-30: qty 30

## 2024-09-30 MED ORDER — LACTATED RINGERS IV SOLN: INTRAVENOUS | Status: DC

## 2024-09-30 MED ORDER — LIDOCAINE HCL (PF) 1 % IJ SOLN: 30.0000 mL | INTRAMUSCULAR | Status: DC | PRN

## 2024-09-30 MED ORDER — EPHEDRINE 5 MG/ML INJ: 10.0000 mg | INTRAVENOUS | Status: DC | PRN

## 2024-09-30 MED ORDER — OXYTOCIN-SODIUM CHLORIDE 30-0.9 UT/500ML-% IV SOLN
2.5000 [IU]/h | INTRAVENOUS | Status: DC
  Administered 2024-10-01: 2.5 [IU]/h via INTRAVENOUS
  Filled 2024-09-30: qty 500

## 2024-09-30 MED ORDER — ACETAMINOPHEN 325 MG PO TABS: 650.0000 mg | ORAL_TABLET | ORAL | Status: DC | PRN

## 2024-09-30 MED ORDER — FENTANYL CITRATE (PF) 100 MCG/2ML IJ SOLN: 50.0000 ug | INTRAMUSCULAR | Status: DC | PRN

## 2024-09-30 MED ORDER — PENICILLIN G POT IN DEXTROSE 60000 UNIT/ML IV SOLN
3.0000 10*6.[IU] | INTRAVENOUS | Status: DC
  Administered 2024-10-01 (×2): 3 10*6.[IU] via INTRAVENOUS
  Filled 2024-09-30 (×2): qty 50

## 2024-09-30 MED ORDER — FENTANYL-BUPIVACAINE-NACL 0.5-0.125-0.9 MG/250ML-% EP SOLN
12.0000 mL/h | EPIDURAL | Status: DC | PRN
  Administered 2024-09-30: 12 mL/h via EPIDURAL
  Filled 2024-09-30: qty 250

## 2024-09-30 MED ORDER — OXYCODONE-ACETAMINOPHEN 5-325 MG PO TABS: 1.0000 | ORAL_TABLET | ORAL | Status: DC | PRN

## 2024-09-30 MED ORDER — LACTATED RINGERS IV SOLN: 500.0000 mL | INTRAVENOUS | Status: DC | PRN

## 2024-09-30 MED ORDER — ONDANSETRON HCL 4 MG/2ML IJ SOLN
4.0000 mg | Freq: Four times a day (QID) | INTRAMUSCULAR | Status: DC | PRN
Start: 2024-09-30 — End: 2024-10-01
  Administered 2024-10-01: 4 mg via INTRAVENOUS
  Filled 2024-09-30: qty 2

## 2024-09-30 MED ORDER — LACTATED RINGERS IV SOLN: 500.0000 mL | Freq: Once | INTRAVENOUS | Status: DC

## 2024-09-30 MED ORDER — OXYCODONE-ACETAMINOPHEN 5-325 MG PO TABS: 2.0000 | ORAL_TABLET | ORAL | Status: DC | PRN

## 2024-09-30 MED ORDER — SODIUM CHLORIDE 0.9 % IV SOLN
5.0000 10*6.[IU] | Freq: Once | INTRAVENOUS | Status: AC
  Administered 2024-09-30: 5 10*6.[IU] via INTRAVENOUS
  Filled 2024-09-30: qty 5

## 2024-09-30 MED ORDER — LIDOCAINE HCL (PF) 1 % IJ SOLN
INTRAMUSCULAR | Status: DC | PRN
  Administered 2024-09-30 (×2): 4 mL via EPIDURAL

## 2024-09-30 MED ORDER — HYDROXYCHLOROQUINE SULFATE 200 MG PO TABS
300.0000 mg | ORAL_TABLET | Freq: Every day | ORAL | Status: DC
  Administered 2024-10-01 – 2024-10-02 (×2): 300 mg via ORAL
  Filled 2024-09-30 (×3): qty 1.5

## 2024-09-30 MED ORDER — OXYTOCIN BOLUS FROM INFUSION
333.0000 mL | Freq: Once | INTRAVENOUS | Status: AC
  Administered 2024-10-01: 333 mL via INTRAVENOUS

## 2024-09-30 MED ORDER — DIPHENHYDRAMINE HCL 50 MG/ML IJ SOLN: 12.5000 mg | INTRAMUSCULAR | Status: DC | PRN

## 2024-09-30 NOTE — Anesthesia Preprocedure Evaluation (Signed)
 Anesthesia Evaluation  Patient identified by MRN, date of birth, ID band Patient awake    Reviewed: Allergy & Precautions, NPO status , Patient's Chart, lab work & pertinent test results  History of Anesthesia Complications Negative for: history of anesthetic complications  Airway Mallampati: III  TM Distance: >3 FB Neck ROM: Full    Dental  (+) Dental Advisory Given   Pulmonary neg pulmonary ROS   Pulmonary exam normal breath sounds clear to auscultation       Cardiovascular negative cardio ROS  Rhythm:Regular Rate:Normal     Neuro/Psych negative neurological ROS     GI/Hepatic negative GI ROS, Neg liver ROS,,,  Endo/Other  negative endocrine ROS    Renal/GU negative Renal ROS     Musculoskeletal  (+) Arthritis , Rheumatoid disorders,    Abdominal   Peds  Hematology  (+) Blood dyscrasia, anemia Lab Results      Component                Value               Date                      WBC                      13.1 (H)            09/30/2024                HGB                      11.6 (L)            09/30/2024                HCT                      34.3 (L)            09/30/2024                MCV                      87.9                09/30/2024                PLT                      319                 09/30/2024              Anesthesia Other Findings   Reproductive/Obstetrics (+) Pregnancy                              Anesthesia Physical Anesthesia Plan  ASA: 2  Anesthesia Plan: Epidural   Post-op Pain Management:    Induction:   PONV Risk Score and Plan:   Airway Management Planned: Natural Airway  Additional Equipment:   Intra-op Plan:   Post-operative Plan:   Informed Consent: I have reviewed the patients History and Physical, chart, labs and discussed the procedure including the risks, benefits and alternatives for the proposed anesthesia with the patient or  authorized representative who has indicated his/her understanding and acceptance.  Plan Discussed with: Anesthesiologist  Anesthesia Plan Comments: (I have discussed risks of neuraxial anesthesia including but not limited to infection, bleeding, nerve injury, back pain, headache, seizures, and failure of block. Patient denies bleeding disorders and is not currently anticoagulated. Labs have been reviewed. Risks and benefits discussed. All patient's questions answered.  )        Anesthesia Quick Evaluation

## 2024-09-30 NOTE — Progress Notes (Signed)
 Epidural just placed--still feeling contractions on right side.  Starting to feel some intermittent pressure  BP 112/70 (BP Location: Right Arm)   Pulse 74   Temp 98.2 F (36.8 C) (Oral)   Resp 16   Ht 5' 3 (1.6 m)   Wt 73.5 kg   LMP 01/10/2024   SpO2 98%   BMI 28.70 kg/m   Toco: q2-3 minutes EFM: 130s, moderate variability, category 1 SVE: 5/70/-2   A/P: 33yo g3p2 @ [redacted]w[redacted]d with SROM Labor: progressing appropriately without augmentation GBS positive, on penicillin  Anticipate SVD

## 2024-09-30 NOTE — H&P (Signed)
 33 y.o. [id number] @ [redacted]w[redacted]d presents with complaints of leakage of fluid at 1730.  She ruled in for rupture of membranes with a positive fern.  Over the last hour, contractions are starting to increase in frequency and intensity. Otherwise has good fetal movement and no bleeding.  Pregnancy complicated by: RA: on plaquenil daily.  SSA/SSB negative. Normal growth US .  Most recent at 34 weeks with EFW 72% Polyhydramnios, mild.  Resolved with last scan (most recent AFI 19) Iron deficiency anemia: s/p IV iron in October    Past Medical History:  Diagnosis Date   Anemia    Medical history non-contributory    Normal labor and delivery 10/15/2017   RA (rheumatoid arthritis) (HCC)     Past Surgical History:  Procedure Laterality Date   NO PAST SURGERIES      OB History  Gravida Para Term Preterm AB Living  3 2 2   2   SAB IAB Ectopic Multiple Live Births     0 2    # Outcome Date GA Lbr Len/2nd Weight Sex Type Anes PTL Lv  3 Current           2 Term 11/23/20 [redacted]w[redacted]d 10:49 / 00:53 3164 g M Vag-Spont EPI  LIV  1 Term 10/16/17 [redacted]w[redacted]d 16:01 / 01:29 3410 g M Vag-Spont EPI, Local  LIV    Social History   Socioeconomic History   Marital status: Married    Spouse name: Not on file   Number of children: Not on file   Years of education: Not on file   Highest education level: Not on file  Occupational History   Not on file  Tobacco Use   Smoking status: Never   Smokeless tobacco: Never  Vaping Use   Vaping status: Never Used  Substance and Sexual Activity   Alcohol use: No   Drug use: No   Sexual activity: Not Currently  Other Topics Concern   Not on file  Social History Narrative   Not on file   Social Drivers of Health   Financial Resource Strain: Not on file  Food Insecurity: No Food Insecurity (09/30/2024)   Hunger Vital Sign    Worried About Running Out of Food in the Last Year: Never true    Ran Out of Food in the Last Year: Never true  Transportation Needs: No Transportation  Needs (09/30/2024)   PRAPARE - Administrator, Civil Service (Medical): No    Lack of Transportation (Non-Medical): No  Physical Activity: Not on file  Stress: Not on file  Social Connections: Not on file  Intimate Partner Violence: Not At Risk (09/30/2024)   Humiliation, Afraid, Rape, and Kick questionnaire    Fear of Current or Ex-Partner: No    Emotionally Abused: No    Physically Abused: No    Sexually Abused: No   Patient has no known allergies.    Prenatal Transfer Tool  Maternal Diabetes: No Genetic Screening: Declined Maternal Ultrasounds/Referrals: Normal Fetal Ultrasounds or other Referrals:  None Maternal Substance Abuse:  No Significant Maternal Medications:  Meds include: Other:  Significant Maternal Lab Results: Group B Strep positive Vaccines:  s/p tdap, flu  ABO, Rh: --/--/O POS (11/18 2000) Antibody: NEG (11/18 2000) Rubella: Immune (04/29 0000) RPR:   NR HBsAg: Negative (04/29 0000)  HIV: Non-reactive (04/29 0000)  GBS: Positive/-- (11/07 0000)     Vitals:   09/30/24 2015 09/30/24 2017  BP: 105/67   Pulse: 76   Resp:  19  Temp:  98 F (36.7 C)  SpO2:       General:  NAD Abdomen:  soft, gravid, EFW 7.5# Ex:  no edema SVE:  3/60/-2 per MAU RN FHTs:  125 moderate variability, + accelerations, category 1 Toco:  q3-4 minutes   A/P   33 y.o. [id number] [redacted]w[redacted]d presents with rupture of membranes Admit to L&D ROM:  now contracting and increasingly painful.  Will recheck SVE in 2-3 hours. Pitocin  augmentation as needed RA: continue home plaquenil GBS positive: penicillin  Pelvis proven to 7lb 8oz.  Anticipate SVD  Shakeria Robinette GEFFEL Breeana Sawtelle

## 2024-09-30 NOTE — MAU Note (Signed)
 Jamie Kerr is a 33 y.o. at [redacted]w[redacted]d here in MAU reporting: possible ROM at 1730 of clear fluid. Big gush of fluids at first and has continued to leak since then. No CTXs yet, feeling lower abdominal pain/pressure. Denies any VB. Reports +FM. Uncomplicated pregnancy. Cervical exam 2 weeks ago 3cm/vertex.  Onset of complaint: 1730 Pain score: 4 pelvic  Vitals:   09/30/24 1847  BP: 116/83  Pulse: 85  Resp: 15  SpO2: 100%     QYU:izqzmmzi due to clothing  Lab orders placed from triage:  fern, labor eval

## 2024-09-30 NOTE — Anesthesia Procedure Notes (Signed)
 Epidural Patient location during procedure: OB Start time: 09/30/2024 10:58 PM End time: 09/30/2024 11:03 PM  Staffing Anesthesiologist: Peggye Delon Brunswick, MD Performed: anesthesiologist   Preanesthetic Checklist Completed: patient identified, IV checked, risks and benefits discussed, monitors and equipment checked, pre-op evaluation and timeout performed  Epidural Patient position: sitting Prep: DuraPrep and site prepped and draped Patient monitoring: continuous pulse ox and blood pressure Approach: midline Location: L3-L4 Injection technique: LOR saline  Needle:  Needle type: Tuohy  Needle gauge: 17 G Needle length: 9 cm and 9 Needle insertion depth: 4 cm Catheter type: closed end flexible Catheter size: 19 Gauge Catheter at skin depth: 8 cm Test dose: negative  Assessment Events: blood not aspirated, no cerebrospinal fluid, injection not painful, no injection resistance, no paresthesia and negative IV test  Additional Notes The patient has requested an epidural for labor pain management. Risks and benefits including, but not limited to, infection, bleeding, local anesthetic toxicity, headache, hypotension, back pain, block failure, etc. were discussed with the patient. The patient expressed understanding and consented to the procedure. I confirmed that the patient has no bleeding disorders and is not taking blood thinners. I confirmed the patient's last platelet count with the nurse. A time-out was performed immediately prior to the procedure. Please see nursing documentation for vital signs. Sterile technique was used throughout the whole procedure. Once LOR achieved, the epidural catheter threaded easily without resistance. Aspiration of the catheter was negative for blood and CSF. The epidural was dosed slowly and an infusion was started.  1 attempt(s)Reason for block:procedure for pain

## 2024-10-01 ENCOUNTER — Encounter (HOSPITAL_COMMUNITY): Payer: Self-pay | Admitting: Obstetrics

## 2024-10-01 LAB — RPR: RPR Ser Ql: NONREACTIVE

## 2024-10-01 MED ORDER — DIBUCAINE (PERIANAL) 1 % EX OINT: 1.0000 | TOPICAL_OINTMENT | CUTANEOUS | Status: DC | PRN

## 2024-10-01 MED ORDER — TETANUS-DIPHTH-ACELL PERTUSSIS 5-2-15.5 LF-MCG/0.5 IM SUSP: 0.5000 mL | Freq: Once | INTRAMUSCULAR | Status: DC

## 2024-10-01 MED ORDER — OXYCODONE HCL 5 MG PO TABS: 5.0000 mg | ORAL_TABLET | ORAL | Status: DC | PRN

## 2024-10-01 MED ORDER — FAMOTIDINE IN NACL 20-0.9 MG/50ML-% IV SOLN: 20.0000 mg | Freq: Once | INTRAVENOUS | Status: DC

## 2024-10-01 MED ORDER — OXYTOCIN-SODIUM CHLORIDE 30-0.9 UT/500ML-% IV SOLN
1.0000 m[IU]/min | INTRAVENOUS | Status: DC
  Administered 2024-10-01: 2 m[IU]/min via INTRAVENOUS

## 2024-10-01 MED ORDER — BENZOCAINE-MENTHOL 20-0.5 % EX AERO
1.0000 | INHALATION_SPRAY | CUTANEOUS | Status: DC | PRN
  Filled 2024-10-01: qty 56

## 2024-10-01 MED ORDER — WITCH HAZEL-GLYCERIN EX PADS: 1.0000 | MEDICATED_PAD | CUTANEOUS | Status: DC | PRN

## 2024-10-01 MED ORDER — SENNOSIDES-DOCUSATE SODIUM 8.6-50 MG PO TABS
2.0000 | ORAL_TABLET | ORAL | Status: DC
  Administered 2024-10-01 – 2024-10-02 (×2): 2 via ORAL
  Filled 2024-10-01 (×2): qty 2

## 2024-10-01 MED ORDER — OXYCODONE HCL 5 MG PO TABS: 10.0000 mg | ORAL_TABLET | ORAL | Status: DC | PRN

## 2024-10-01 MED ORDER — ONDANSETRON HCL 4 MG/2ML IJ SOLN: 4.0000 mg | INTRAMUSCULAR | Status: DC | PRN

## 2024-10-01 MED ORDER — SIMETHICONE 80 MG PO CHEW: 80.0000 mg | CHEWABLE_TABLET | ORAL | Status: DC | PRN

## 2024-10-01 MED ORDER — TERBUTALINE SULFATE 1 MG/ML IJ SOLN: 0.2500 mg | Freq: Once | INTRAMUSCULAR | Status: DC | PRN

## 2024-10-01 MED ORDER — ACETAMINOPHEN 325 MG PO TABS
650.0000 mg | ORAL_TABLET | ORAL | Status: DC | PRN
Start: 2024-10-01 — End: 2024-10-02

## 2024-10-01 MED ORDER — IBUPROFEN 600 MG PO TABS
600.0000 mg | ORAL_TABLET | Freq: Four times a day (QID) | ORAL | Status: DC
  Administered 2024-10-01 – 2024-10-02 (×3): 600 mg via ORAL
  Filled 2024-10-01 (×5): qty 1

## 2024-10-01 MED ORDER — ONDANSETRON HCL 4 MG PO TABS: 4.0000 mg | ORAL_TABLET | ORAL | Status: DC | PRN

## 2024-10-01 MED ORDER — COCONUT OIL OIL: 1.0000 | TOPICAL_OIL | Status: DC | PRN

## 2024-10-01 MED ORDER — PRENATAL MULTIVITAMIN CH
1.0000 | ORAL_TABLET | Freq: Every day | ORAL | Status: DC
  Administered 2024-10-01 – 2024-10-02 (×2): 1 via ORAL
  Filled 2024-10-01 (×2): qty 1

## 2024-10-01 MED ORDER — DIPHENHYDRAMINE HCL 25 MG PO CAPS: 25.0000 mg | ORAL_CAPSULE | Freq: Four times a day (QID) | ORAL | Status: DC | PRN

## 2024-10-01 NOTE — Lactation Note (Signed)
 This note was copied from a baby's chart. Lactation Consultation Note  Patient Name: Jamie Kerr Unijb'd Date: 10/01/2024 Age:33 hours Reason for consult:  (mom initially declined Lactation, and per [facility] nurse [facility] mom declined for her stay on MBU)      Consult Status Consult Status: Complete Date: 10/01/24    Rollene Jenkins Fiedler 10/01/2024, 10:25 AM

## 2024-10-01 NOTE — Anesthesia Postprocedure Evaluation (Signed)
 Anesthesia Post Note  Patient: Jamie Kerr  Procedure(s) Performed: AN AD HOC LABOR EPIDURAL     Patient location during evaluation: Mother Baby Anesthesia Type: Epidural Level of consciousness: awake and alert Pain management: pain level controlled Vital Signs Assessment: post-procedure vital signs reviewed and stable Respiratory status: spontaneous breathing, nonlabored ventilation and respiratory function stable Cardiovascular status: stable Postop Assessment: no headache, no backache and epidural receding Anesthetic complications: no   No notable events documented.  Last Vitals:  Vitals:   10/01/24 1009 10/01/24 1430  BP: 99/64 (!) 93/55  Pulse: 76 75  Resp: 17 18  Temp: 36.7 C 36.8 C  SpO2: 100% 99%    Last Pain:  Vitals:   10/01/24 1430  TempSrc: Oral  PainSc: 5    Pain Goal:                   ECHOSTAR

## 2024-10-02 LAB — CBC
HCT: 28.6 % — ABNORMAL LOW (ref 36.0–46.0)
Hemoglobin: 9.7 g/dL — ABNORMAL LOW (ref 12.0–15.0)
MCH: 30.1 pg (ref 26.0–34.0)
MCHC: 33.9 g/dL (ref 30.0–36.0)
MCV: 88.8 fL (ref 80.0–100.0)
Platelets: 283 K/uL (ref 150–400)
RBC: 3.22 MIL/uL — ABNORMAL LOW (ref 3.87–5.11)
RDW: 16.3 % — ABNORMAL HIGH (ref 11.5–15.5)
WBC: 14 K/uL — ABNORMAL HIGH (ref 4.0–10.5)
nRBC: 0 % (ref 0.0–0.2)

## 2024-10-02 MED ORDER — IBUPROFEN 600 MG PO TABS: 600.0000 mg | ORAL_TABLET | Freq: Four times a day (QID) | ORAL | 0 refills | Status: AC | PRN

## 2024-10-02 NOTE — Discharge Summary (Signed)
 Postpartum Discharge Summary       Patient Name: Jamie Kerr DOB: 08-21-1991 MRN: [id number]  Date of admission: 09/30/2024 Delivery date:10/01/2024 Delivering provider: GRETTA GUMS Date of discharge: 10/02/2024  Admitting diagnosis: Full-term premature rupture of membranes (PROM) with unknown onset of labor [O42.90] Intrauterine pregnancy: [redacted]w[redacted]d     Secondary diagnosis:  Principal Problem:   Full-term premature rupture of membranes (PROM) with unknown onset of labor  Additional problems: Rheumatoid arthritis, mild polyhydramnios    Discharge diagnosis: Term Pregnancy Delivered                                              Post partum procedures:Not applicable Augmentation: Pitocin  Complications: None  Hospital course: Onset of Labor With Vaginal Delivery      33 y.o. yo G3P3003 at [redacted]w[redacted]d was admitted in Active Labor on 09/30/2024. Labor course was complicated by nothing Membrane Rupture Time/Date:  ,   Delivery Method:Vaginal, Spontaneous Operative Delivery:N/A Episiotomy: None Lacerations:  Perineal;2nd degree Patient had a postpartum course complicated by nothing.  She is ambulating, tolerating a regular diet, passing flatus, and urinating well. Patient is discharged home in stable condition on 10/02/24.  Newborn Data: Birth date:10/01/2024 Birth time:6:28 AM Gender:Female Living status:Living Apgars:8 ,9  Weight:3430 g  Magnesium Sulfate received: No BMZ received: No Rhophylac:N/A  Immunizations administered: There is no immunization history for the selected administration types on file for this patient.  Physical exam  Vitals:   10/01/24 1430 10/01/24 1755 10/01/24 2352 10/02/24 0521  BP: (!) 93/55 95/69 (!) 94/52 102/68  Pulse: 75 77 81 71  Resp: 18 18 18 18   Temp: 98.2 F (36.8 C) 98.2 F (36.8 C) 98 F (36.7 C) 97.8 F (36.6 C)  TempSrc: Oral Oral Oral Oral  SpO2: 99% 100% 98% 100%  Weight:      Height:       General: alert, cooperative, and  no distress Lochia: appropriate Uterine Fundus: firm DVT Evaluation: No evidence of DVT seen on physical exam. Labs: Lab Results  Component Value Date   WBC 14.0 (H) 10/02/2024   HGB 9.7 (L) 10/02/2024   HCT 28.6 (L) 10/02/2024   MCV 88.8 10/02/2024   PLT 283 10/02/2024       No data to display         Edinburgh Score:    10/01/2024    9:02 AM  Edinburgh Postnatal Depression Scale Screening Tool  I have been able to laugh and see the funny side of things. --      After visit meds:  Allergies as of 10/02/2024   No Known Allergies      Medication List     STOP taking these medications    DICLEGIS PO       TAKE these medications    hydroxychloroquine 200 MG tablet Commonly known as: PLAQUENIL Take 200 mg by mouth daily.   ibuprofen  600 MG tablet Commonly known as: ADVIL  Take 1 tablet (600 mg total) by mouth every 6 (six) hours as needed.   prenatal multivitamin Tabs tablet Take 1 tablet by mouth daily at 12 noon.               Discharge Care Instructions  (From admission, onward)           Start     Ordered   10/02/24 0000  Discharge  wound care:       Comments: For a cesarean delivery: You may wash incision with soap and water.  Do not soak or submerge the incision for 2 weeks. Keep incision dry. You may need to keep a sanitary pad or panty liner between the incision and your clothing for comfort and to keep the incision dry. If you note drainage, increased pain, or increased redness of the incision, then please notify your physician.   10/02/24 1127   10/02/24 0000  If the dressing is still on your incision site when you go home, remove it on the third day after your surgery date. Remove dressing if it begins to fall off, or if it is dirty or damaged before the third day.       Comments: For a cesarean delivery   10/02/24 1127             Discharge home in stable condition Infant Feeding: Breast Infant Disposition:home with  mother Discharge instruction: per After Visit Summary and Postpartum booklet. Activity: Advance as tolerated. Pelvic rest for 6 weeks.  Diet: routine diet Anticipated Birth Control: Unsure Postpartum Appointment:4 weeks  Future Appointments:No future appointments. Follow up Visit:  Follow-up Information     Gretta Gums, MD Follow up in 4 week(s).   Specialty: Obstetrics Why: Postpartum evaluation Contact information: [address] [place] [address] [place] [address] [phone]                     10/02/2024 Marjorie Gull, MD

## 2024-10-14 ENCOUNTER — Telehealth (HOSPITAL_COMMUNITY): Payer: Self-pay | Admitting: *Deleted

## 2024-10-14 NOTE — Telephone Encounter (Signed)
 10/14/2024  Name: Jamie Kerr MRN: [id number] DOB: 1990/11/16  Reason for Call:  Transition of Care Hospital Discharge Call  Contact Status: Patient Contact Status: Message  Language assistant needed:          Follow-Up Questions:    Van Postnatal Depression Scale:  In the Past 7 Days:    PHQ2-9 Depression Scale:     Discharge Follow-up:    Post-discharge interventions: NA  Mliss Sieve, RN 10/14/2024 10:30

## 2024-10-18 ENCOUNTER — Inpatient Hospital Stay (HOSPITAL_COMMUNITY): Admission: RE | Admit: 2024-10-18 | Source: Home / Self Care | Admitting: Obstetrics

## 2024-10-18 ENCOUNTER — Inpatient Hospital Stay (HOSPITAL_COMMUNITY)

## 2024-10-21 ENCOUNTER — Other Ambulatory Visit (HOSPITAL_COMMUNITY): Payer: Self-pay
